# Patient Record
Sex: Female | Born: 1935 | Race: White | Hispanic: No | Marital: Single | Smoking: Never smoker
Health system: Southern US, Community
[De-identification: ages and names within clinical notes are randomized; demographics above are authoritative.]

## PROBLEM LIST (undated history)

## (undated) DIAGNOSIS — J189 Pneumonia, unspecified organism: Secondary | ICD-10-CM

## (undated) DIAGNOSIS — K219 Gastro-esophageal reflux disease without esophagitis: Secondary | ICD-10-CM

## (undated) HISTORY — DX: Gastro-esophageal reflux disease without esophagitis: K21.9

## (undated) HISTORY — PX: BREAST LUMPECTOMY: SHX2

## (undated) HISTORY — DX: Pneumonia, unspecified organism: J18.9

---

## 1993-02-27 HISTORY — PX: TOTAL KNEE ARTHROPLASTY: SHX125

## 2004-06-29 ENCOUNTER — Ambulatory Visit: Payer: Self-pay | Admitting: Internal Medicine

## 2004-12-26 ENCOUNTER — Ambulatory Visit: Payer: Self-pay | Admitting: Internal Medicine

## 2005-06-26 ENCOUNTER — Ambulatory Visit: Payer: Self-pay | Admitting: Internal Medicine

## 2005-07-18 ENCOUNTER — Ambulatory Visit: Payer: Self-pay | Admitting: Internal Medicine

## 2005-07-25 ENCOUNTER — Ambulatory Visit: Payer: Self-pay | Admitting: Internal Medicine

## 2005-08-18 ENCOUNTER — Ambulatory Visit: Payer: Self-pay | Admitting: Internal Medicine

## 2005-09-22 ENCOUNTER — Ambulatory Visit: Payer: Self-pay | Admitting: Internal Medicine

## 2006-02-23 ENCOUNTER — Ambulatory Visit: Payer: Self-pay | Admitting: Internal Medicine

## 2006-08-22 ENCOUNTER — Ambulatory Visit: Payer: Self-pay | Admitting: Internal Medicine

## 2006-12-11 DIAGNOSIS — K219 Gastro-esophageal reflux disease without esophagitis: Secondary | ICD-10-CM

## 2006-12-11 DIAGNOSIS — J4541 Moderate persistent asthma with (acute) exacerbation: Secondary | ICD-10-CM | POA: Insufficient documentation

## 2006-12-11 DIAGNOSIS — J302 Other seasonal allergic rhinitis: Secondary | ICD-10-CM | POA: Insufficient documentation

## 2006-12-11 DIAGNOSIS — J3089 Other allergic rhinitis: Secondary | ICD-10-CM

## 2007-02-22 ENCOUNTER — Ambulatory Visit: Payer: Self-pay | Admitting: Internal Medicine

## 2007-03-14 ENCOUNTER — Telehealth: Payer: Self-pay | Admitting: Internal Medicine

## 2007-04-29 IMAGING — CT CT PARANASAL SINUSES LIMITED
1 of 2 series · 16 of 25 positions shown, 20 images · non-contrast
Comparison: none

CLINICAL DATA: Sinus infection.  Antibiotics.  Nasal congestion.  
 LIMITED CT OF PARANASAL SINUSES:
TECHNIQUE: Limited coronal CT images were obtained through the paranasal sinuses without intravenous contrast.

[Series 4: ltd sinus 3.0 h30s · axial · 0.27mm/px · z∈[-121,-23]mm · 16 of 24 slices shown, 20 images]
[im 2/24  brain]
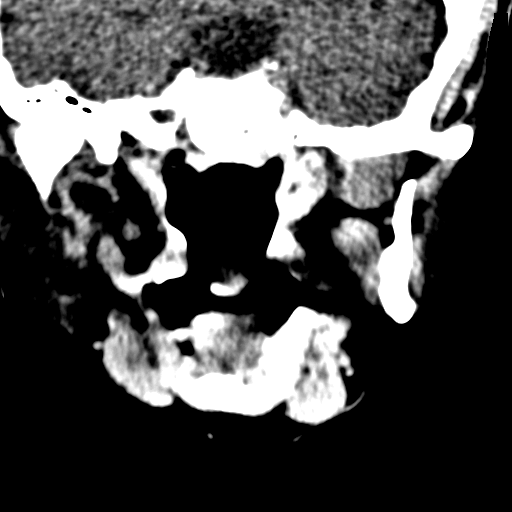
[im 2/24  bone]
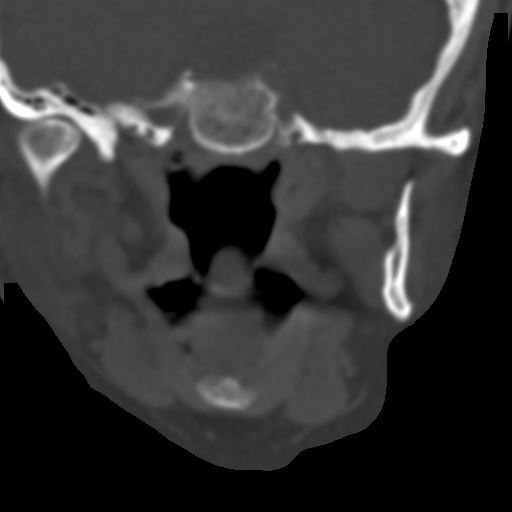
[im 4/24  bone]
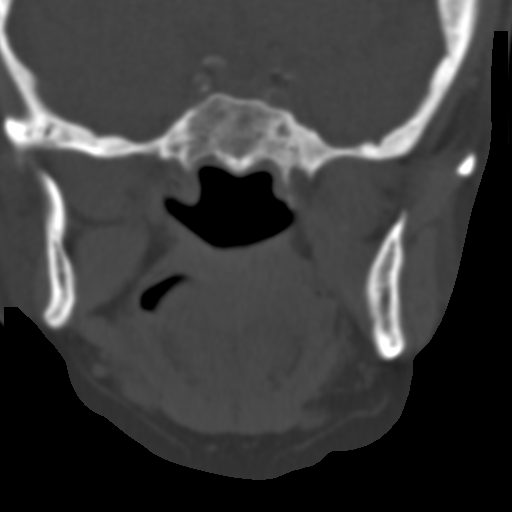
[im 5/24  bone]
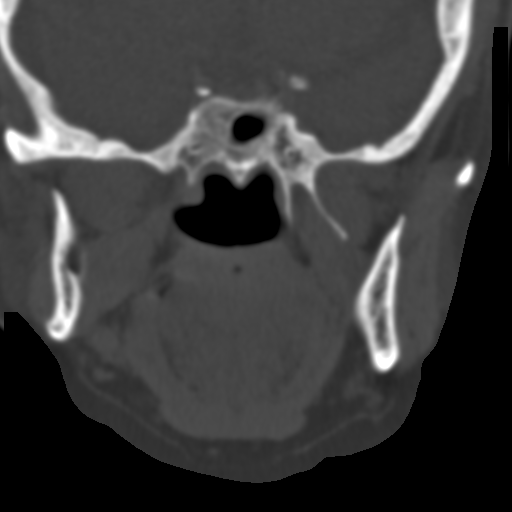
[im 6/24  bone]
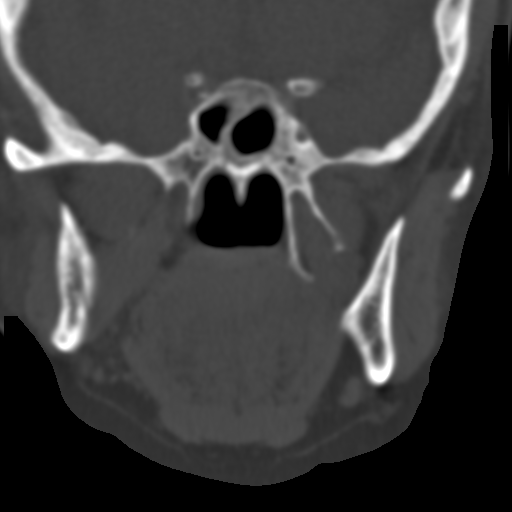
[im 8/24  brain]
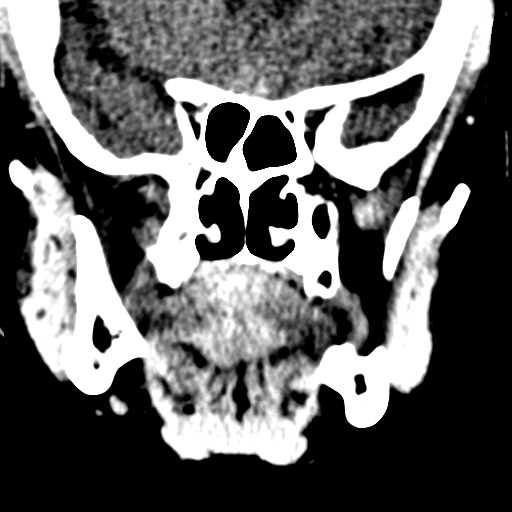
[im 8/24  bone]
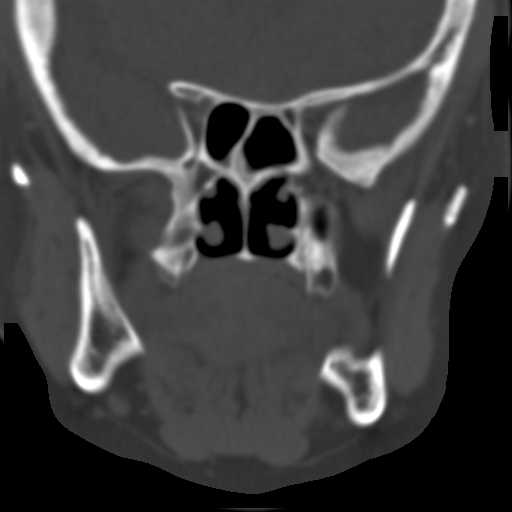
[im 9/24  bone]
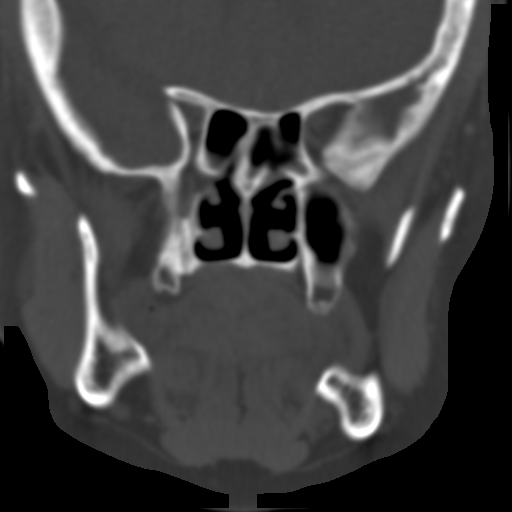
[im 10/24  bone]
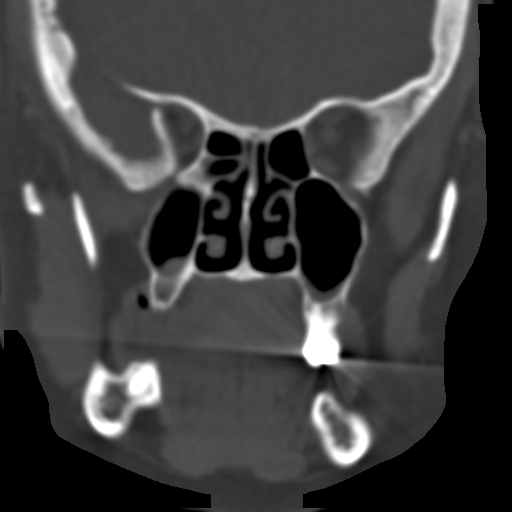
[im 12/24  bone]
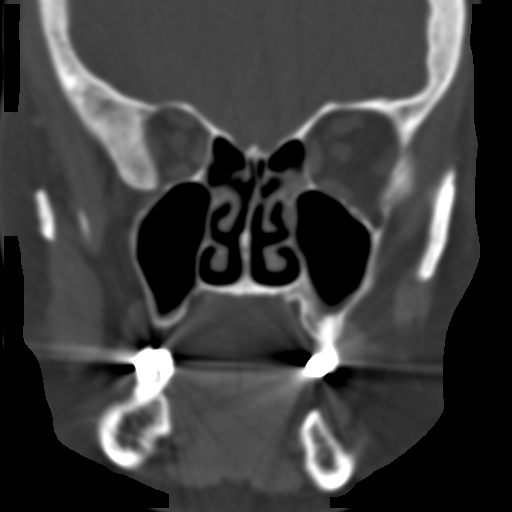
[im 13/24  brain]
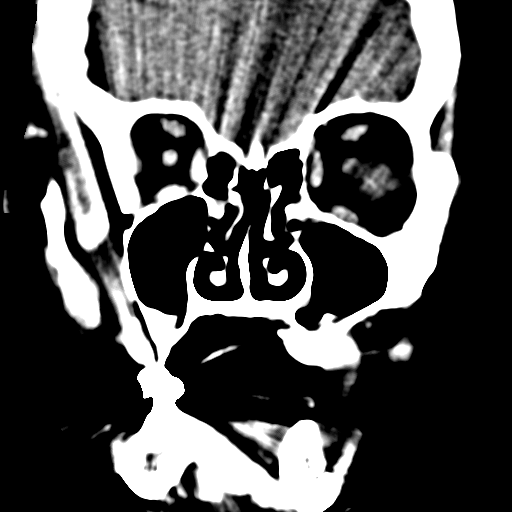
[im 13/24  bone]
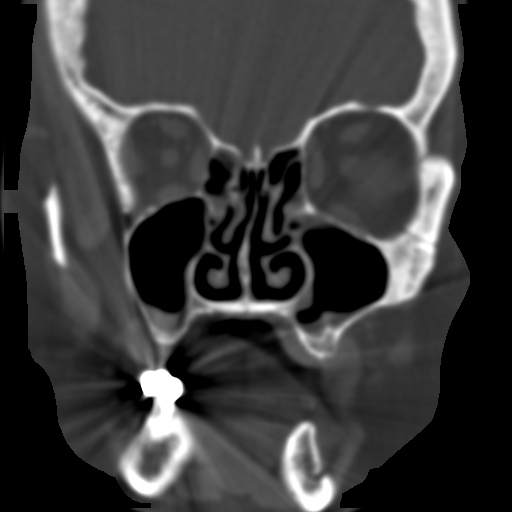
[im 15/24  bone]
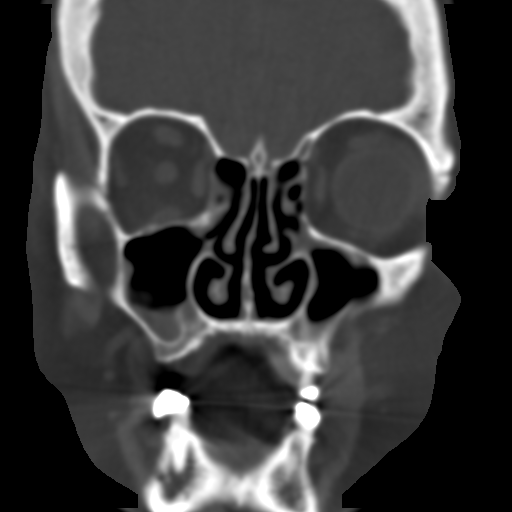
[im 16/24  bone]
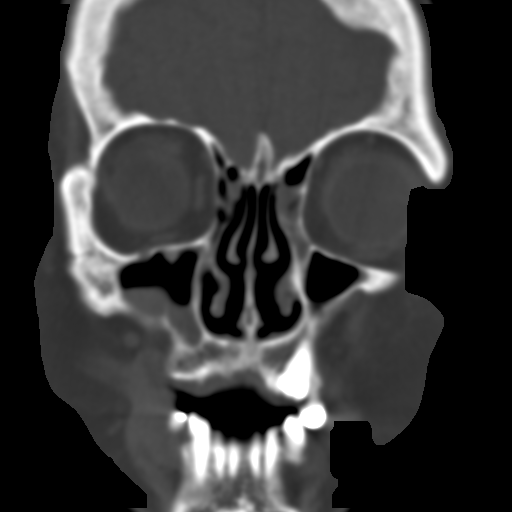
[im 17/24  bone]
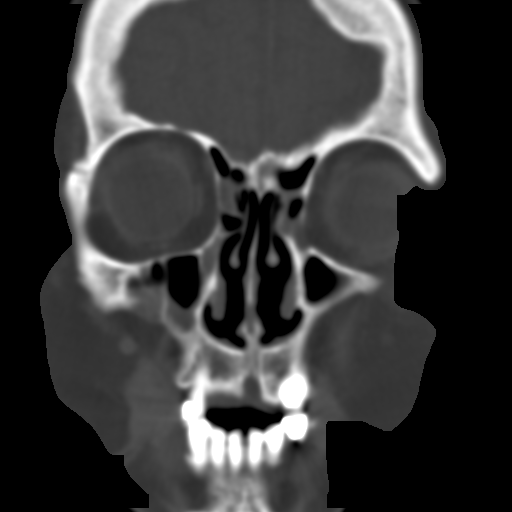
[im 19/24  brain]
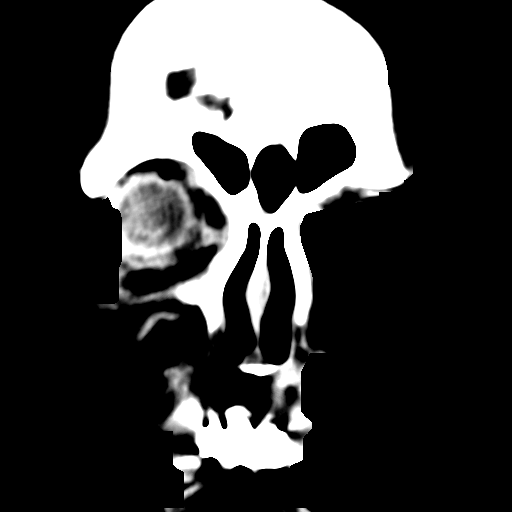
[im 19/24  bone]
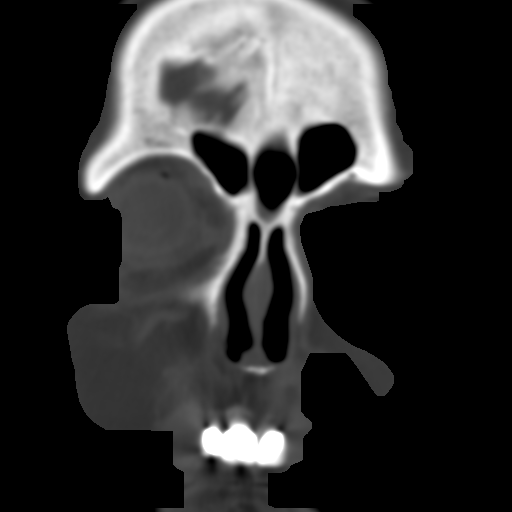
[im 20/24  bone]
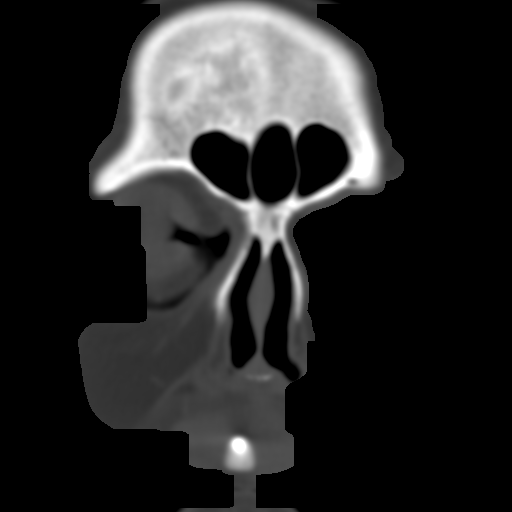
[im 21/24  bone]
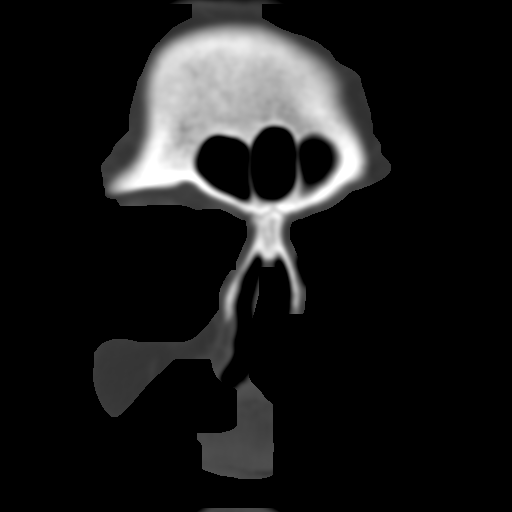
[im 23/24  bone]
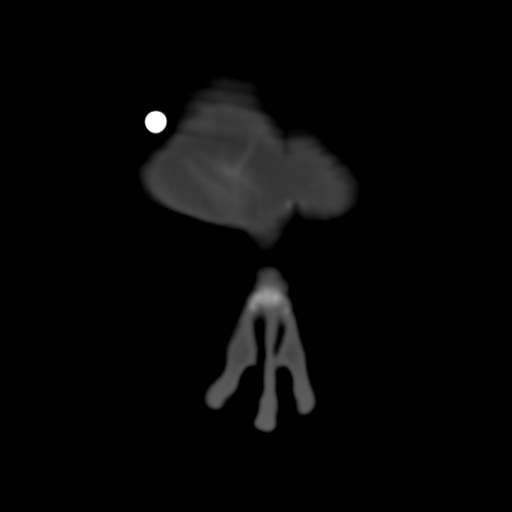

[16 of 25 positions shown; findings below may reference images not displayed]

FINDINGS: Pansinus mucosal thickening involving the maxillary sinuses including the ostial regions.  Mucosal thickening encroaching on the infundibula.  By comparison, minimal sphenoid sinus mucosal thickening.  Mucosal thickening involving the ethmoid sinuses bilaterally with opacification of a few ethmoid sinus air cells.  Mucosal thickening including the floor of the frontal sinus.  Mild nasal septal deviation.  Middle meatus patent.  Turbinates symmetrical.  No bony sinus wall abnormalities.  No air-fluid levels.
IMPRESSION: Findings compatible with chronic pansinusitis.  Mucosal disease encroaching on the maxillary sinus ostia and infundibula.

## 2007-08-23 ENCOUNTER — Ambulatory Visit: Payer: Self-pay | Admitting: Internal Medicine

## 2007-12-10 ENCOUNTER — Encounter: Payer: Self-pay | Admitting: Internal Medicine

## 2008-02-06 ENCOUNTER — Telehealth (INDEPENDENT_AMBULATORY_CARE_PROVIDER_SITE_OTHER): Payer: Self-pay | Admitting: *Deleted

## 2008-02-07 ENCOUNTER — Telehealth (INDEPENDENT_AMBULATORY_CARE_PROVIDER_SITE_OTHER): Payer: Self-pay | Admitting: *Deleted

## 2008-03-19 ENCOUNTER — Ambulatory Visit: Payer: Self-pay | Admitting: Internal Medicine

## 2008-03-19 DIAGNOSIS — J329 Chronic sinusitis, unspecified: Secondary | ICD-10-CM

## 2008-11-23 ENCOUNTER — Telehealth: Payer: Self-pay | Admitting: Internal Medicine

## 2009-03-19 ENCOUNTER — Ambulatory Visit: Payer: Self-pay | Admitting: Internal Medicine

## 2009-10-25 ENCOUNTER — Telehealth (INDEPENDENT_AMBULATORY_CARE_PROVIDER_SITE_OTHER): Payer: Self-pay | Admitting: *Deleted

## 2010-03-15 ENCOUNTER — Ambulatory Visit
Admission: RE | Admit: 2010-03-15 | Discharge: 2010-03-15 | Payer: Self-pay | Source: Home / Self Care | Attending: Internal Medicine | Admitting: Internal Medicine

## 2010-03-15 DIAGNOSIS — J4530 Mild persistent asthma, uncomplicated: Secondary | ICD-10-CM | POA: Insufficient documentation

## 2010-03-29 NOTE — Progress Notes (Signed)
Summary: speak to nurse  Phone Note Call from Patient Call back at Home Phone [phone]   Caller: Patient Call For: young Summary of Call: Pt c/o hoarseness, sore throat, watery eyes x2 wks. wants an antibiotic called in, pls advise.//[facility] Initial call taken by: Darletta Moll,  October 25, 2009 3:04 PM  Follow-up for Phone Call        Dr. Maple Hudson, pt c/o voice "coming and going," nasal congestion, watery eyes, and occ green mucus x 2 wk.  Denies f/c/s.  Requesting abx.   [facility] [address] [facility] in [place] Allergies: floxin, asa, tylenox, lisinopril, niaspan, voltaren, levaquin Pls advise.  Follow-up by: Gweneth Dimitri RN,  October 25, 2009 3:13 PM  Additional Follow-up for Phone Call Additional follow up Details #1::        Per CDY- okay to give Doxycycline 100mg  #8 take 2 today then 1 daily unitl gone no refills.Reynaldo Minium CMA  October 25, 2009 3:35 PM   Called, spoke with pt.  Pt informed of above recs per CY and aware doxy rx sent to [facility].  She verbalized understanding  Additional Follow-up by: Gweneth Dimitri RN,  October 25, 2009 3:40 PM    New/Updated Medications: DOXYCYCLINE HYCLATE 100 MG CAPS (DOXYCYCLINE HYCLATE) take 2 today then 1 by mouth once daily until gone Prescriptions: DOXYCYCLINE HYCLATE 100 MG CAPS (DOXYCYCLINE HYCLATE) take 2 today then 1 by mouth once daily until gone  #8 x 0   Entered by:   Gweneth Dimitri RN   Authorized by:   Waymon Budge MD   Signed by:   Gweneth Dimitri RN on 10/25/2009   Method used:   Electronically to        [facility]* (retail)       [address]       [place], [place]  [address]       Ph: [phone]       Fax: [phone]   RxID:   [id number]

## 2010-03-29 NOTE — Assessment & Plan Note (Signed)
Summary: 1 year fu//mbw   Primary Provider/Referring Provider:  Raeanne Gathers in [place]  CC:  follow up visit-Increased Cough and hoarsness; productive at times-slight yellow x 2 weeks. Marland Kitchen  History of Present Illness: 08/23/07- 77 year old woman returning for follow-up of asthmatic bronchitis and allergic rhinitis.  Five days ago she developed increasing cough with purulent sputum, but no fever.  She, did she's getting better on her own.  Throat is not sore.  No swollen glands.  No GI upset.  03/19/08- Asthmatic bronchits, allergic rhinitis 2 weeks ago resolved rhinosinusitis with called- in Zpak. Was well for a week, but in the past got head congestion, sore throats, no fever or headache. No chest discomfort. Yellow now turning green.Ears stopped up.   March 19, 2009- Asthmatic bronchitis, allergic rhinitis Very good year. She called [place] once months ago. Starting about a week ago she again got a bronchitis with hoarseness, cough/ yellow to white, sore throat, nasal drainage biut no fever. Never got very wheezey. Proair did help. She had flu vax. took amoxacillin in September.    Current Medications (verified): 1)  Hydrochlorothiazide 25 Mg  Tabs (Hydrochlorothiazide) 2)  Synthroid 50 Mcg  Tabs (Levothyroxine Sodium) .Marland Kitchen.. 1 By Mouth Once Daily 3)  Flovent Hfa 110 Mcg/act  Aero (Fluticasone Propionate  Hfa) .... Two Times A Day 4)  Metformin Hcl 1000 Mg  Tabs (Metformin Hcl) .Marland Kitchen.. 1 By Mouth Two Times A Day 5)  Proair Hfa 108 (90 Base) Mcg/act  Aers (Albuterol Sulfate) .... 2 Puffs Four Times A Day As Needed  Allergies: 1)  ! Floxin 2)  ! Asa 3)  ! * Tylenox 4)  ! Lisinopril 5)  ! * Niaspan 6)  Voltaren 7)  Levaquin (Levofloxacin)  Past History:  Past Medical History: Last updated: 03/19/2008 Allergic rhinitis GERD Hx pneumonia  Past Surgical History: Last updated: 03/19/2008 Left knee- TKR 1995 benign left breast lump  Family History: Last updated: 02/22/2007 Grandfather-  MI Brother - MI age 56 Mother lung ca- nonsmoker  Social History: Last updated: 03/19/2009 Patient never smoked.  Never married  Risk Factors: Smoking Status: never (02/22/2007)  Social History: Patient never smoked.  Never married  Review of Systems      See HPI       The patient complains of hoarseness.  The patient denies anorexia, fever, weight loss, weight gain, vision loss, decreased hearing, chest pain, syncope, dyspnea on exertion, peripheral edema, prolonged cough, headaches, hemoptysis, abdominal pain, and severe indigestion/heartburn.    Vital Signs:  Patient profile:   77 year old female Height:      64 inches Weight:      155.25 pounds BMI:     26.74 O2 Sat:      97 % on Room air Pulse rate:   80 / minute BP sitting:   144 / 82  (left arm) Cuff size:   regular  Vitals Entered By: Reynaldo Minium CMA (March 19, 2009 10:08 AM)  O2 Flow:  Room air  Physical Exam  Additional Exam:  General: A/Ox3; pleasant and cooperative, NAD, SKIN: no rash, lesions NODES: no lymphadenopathy HEENT: [place]/AT, EOM- WNL, Conjuctivae- clear, PERRLA, TM-, Nose-thick white mucus bilaterally with nasal speech and periorbital edema- , Throat- clear and wnl NECK: Supple w/ fair ROM, JVD- none, normal carotid impulses w/o bruits Thyroid- normal to palpation CHEST: clear with quiet breathing but coughed after deep breath HEART: RRR, no m/g/r heard ABDOMEN: Soft and nl;  VOZ:DGUY, nl pulses, no edema  NEURO:  Grossly intact to observation      Impression & Recommendations:  Problem # 1:  Hx of ASTHMATIC BRONCHITIS, ACUTE (ICD-466.0)  Recent viral pattern acute bronchitis. We will refill meds on demand. She doesn't need intervention now. She hasn't had cxr in years- we agreed to get that done today. The following medications were removed from the medication list:    Amoxicillin 500 Mg Tabs (Amoxicillin) .Marland Kitchen... Take 1 tablet by mouth three times a day Her updated medication list for  this problem includes:    Flovent Hfa 110 Mcg/act Aero (Fluticasone propionate  hfa) .Marland Kitchen..Marland Kitchen Two times a day    Proair Hfa 108 (90 Base) Mcg/act Aers (Albuterol sulfate) .Marland Kitchen... 2 puffs four times a day as needed  Problem # 2:  ALLERGIC RHINITIS (ICD-477.9) Assessment: Comment Only  Other Orders: Est. Patient Level III (24401) T-2 View CXR (71020TC)  Patient Instructions: 1)  Schedule return in one year, earlier if needed 2)  Call for med refills as needed. 3)  A chest x-ray has been recommended.  Your imaging study may require preauthorization.

## 2010-03-31 NOTE — Assessment & Plan Note (Signed)
Summary: rov 1 yr ///kp   Primary Provider/Referring Provider:  Raeanne Gathers in [place]  CC:  Yearly follow up visit-asthmatic bronchitis and allergic rhinitis-need 90 day refill on flovent HFA sent to drug store.Marland Kitchen  History of Present Illness: 08/23/07- 77 year old woman returning for follow-up of asthmatic bronchitis and allergic rhinitis.  Five days ago she developed increasing cough with purulent sputum, but no fever.  She, did she's getting better on her own.  Throat is not sore.  No swollen glands.  No GI upset.  03/19/08- Asthmatic bronchits, allergic rhinitis 2 weeks ago resolved rhinosinusitis with called- in Zpak. Was well for a week, but in the past got head congestion, sore throats, no fever or headache. No chest discomfort. Yellow now turning green.Ears stopped up.  March 19, 2009- Asthmatic bronchitis, allergic rhinitis Very good year. She called [place] once months ago. Starting about a week ago she again got a bronchitis with hoarseness, cough/ yellow to white, sore throat, nasal drainage biut no fever. Never got very wheezey. Proair did help. She had flu vax. took amoxacillin in September.   March 15, 2010- Asthmatic bronchitis, allergic rhinitis Nurse-CC: Yearly follow up visit-asthmatic bronchitis and allergic rhinitis-need 90 day refill on flovent HFA sent to drug store. She considers this a good year, having needed to call us acutely only once. Had flu vac.  Rare need for rescue inhaler- lst was in October. Today feels well, admitting only a little nasal congestion left nostril, w/o cough or wheeze.  CXR 03/19/09- scarring but NAD.         Preventive Screening-Counseling & Management  Alcohol-Tobacco     Smoking Status: never  Current Medications (verified): 1)  Hydrochlorothiazide 25 Mg  Tabs (Hydrochlorothiazide) 2)  Synthroid 50 Mcg  Tabs (Levothyroxine Sodium) .Marland Kitchen.. 1 By Mouth Once Daily 3)  Flovent Hfa 110 Mcg/act  Aero (Fluticasone Propionate  Hfa) .... Two  Times A Day 4)  Metformin Hcl 1000 Mg  Tabs (Metformin Hcl) .Marland Kitchen.. 1 By Mouth Two Times A Day 5)  Proair Hfa 108 (90 Base) Mcg/act  Aers (Albuterol Sulfate) .... 2 Puffs Four Times A Day As Needed 6)  Restasis 0.05 % Emul (Cyclosporine) .Marland Kitchen.. 1 Drop Each Eye Two Times A Day  Allergies (verified): 1)  ! Floxin 2)  ! Asa 3)  ! * Tylenox 4)  ! Lisinopril 5)  ! * Niaspan 6)  Voltaren 7)  Levaquin (Levofloxacin)  Past History:  Past Surgical History: Last updated: 03/19/2008 Left knee- TKR 1995 benign left breast lump  Family History: Last updated: 02/22/2007 Grandfather- MI Brother - MI age 65 Mother lung ca- nonsmoker  Social History: Last updated: 03/19/2009 Patient never smoked.  Never married  Risk Factors: Smoking Status: never (03/15/2010)  Past Medical History: Allergic rhinitis Asthma  GERD Hx pneumonia  Review of Systems      See HPI       The patient complains of nasal congestion/difficulty breathing through nose.  The patient denies shortness of breath with activity, shortness of breath at rest, productive cough, non-productive cough, coughing up blood, chest pain, irregular heartbeats, acid heartburn, indigestion, loss of appetite, weight change, abdominal pain, difficulty swallowing, sore throat, tooth/dental problems, headaches, and sneezing.    Vital Signs:  Patient profile:   77 year old female Height:      64 inches Weight:      148 pounds BMI:     25.50 O2 Sat:      100 % on Room air Pulse rate:  74 / minute BP sitting:   136 / 78  (left arm) Cuff size:   regular  Vitals Entered By: Reynaldo Minium CMA (March 15, 2010 9:16 AM)  O2 Flow:  Room air CC: Yearly follow up visit-asthmatic bronchitis and allergic rhinitis-need 90 day refill on flovent HFA sent to drug store.   Physical Exam  Additional Exam:  General: A/Ox3; pleasant and cooperative, NAD, SKIN: no rash, lesions NODES: no lymphadenopathy HEENT: /AT, EOM- WNL, Conjuctivae-  clear, PERRLA, TM-, Nose-thick white mucus bilaterally with  slightly nasal speech and periorbital edema- , Throat- clear and wnl NECK: Supple w/ fair ROM, JVD- none, normal carotid impulses w/o bruits Thyroid- normal to palpation CHEST: clear with quiet breathing, no wheeze or cough HEART: RRR, no m/g/r heard ABDOMEN: Soft and nl;  UVO:ZDGU, nl pulses, no edema  NEURO: Grossly intact to observation      Impression & Recommendations:  Problem # 1:  ALLERGIC RHINITIS (ICD-477.9)  Good control with a minimal perennial rhinitis and hx of recurrent rhinosinusitis in the past.   Problem # 2:  ASTHMA, UNSPECIFIED, UNSPECIFIED STATUS (ICD-493.90) Mild intermittent and well controlled.   Medications Added to Medication List This Visit: 1)  Restasis 0.05 % Emul (Cyclosporine) .Marland Kitchen.. 1 drop each eye two times a day  Other Orders: Est. Patient Level III (44034)  Patient Instructions: 1)  Please schedule a follow-up appointment in 1 year. 2)  Refill Flovent for [facility] 3)  Refill Proair rescue inhaler for local refill Prescriptions: PROAIR HFA 108 (90 BASE) MCG/ACT  AERS (ALBUTEROL SULFATE) 2 puffs four times a day as needed  #1 x prn   Entered and Authorized by:   Waymon Budge MD   Signed by:   Waymon Budge MD on 03/15/2010   Method used:   Print then Give to Patient   RxID:   [id number] FLOVENT HFA 110 MCG/ACT  AERO (FLUTICASONE PROPIONATE  HFA) two times a day  #3 x 3   Entered and Authorized by:   Waymon Budge MD   Signed by:   Waymon Budge MD on 03/15/2010   Method used:   Print then Give to Patient   RxID:   [id number]

## 2010-07-12 NOTE — Assessment & Plan Note (Signed)
[facility]                             PULMONARY OFFICE NOTE   NAME:Kathleen Conrad, Kathleen Conrad                        MRN:          [id number]  DATE:08/22/2006                            DOB:          Jan 04, 1930    PROBLEM:  1. Asthmatic bronchitis.  2. Allergic rhinitis.  3. Esophageal reflux.   HISTORY:  Sneezing only. No chest congestion or wheezing. She has been  fairly successful using Clarinex D but needs Flovent 110 refilled.   MEDICATIONS:  1. Hydrochlorothiazide 25 mg.  2. Synthroid 0.05 mg.  3. Glyburide/metformin 2.5/500.  4. Nasonex.  5. Flovent 110 two puffs b.i.d.  6. Clarinex D p.r.n.   DRUG INTOLERANCES:  VOLTAREN, LEVAQUIN, FLOXIN.   OBJECTIVE:  Weight 164 pounds, BP 164/84, pulse 67, room air saturation  96%.  Mild nasal congestion, but turbinates do not look inflamed. Pharynx is  clear. Voice quality is normal.  LUNGS:  Are clear.  HEART:  Sounds normal.   IMPRESSION:  Allergic rhinitis and mild intermittent asthma, adequately  controlled.   PLAN:  We refilled her Flovent. Discussed environmental precautions.  Schedule return in 6 months, earlier p.r.n.     Clinton D. Maple Hudson, MD, Tonny Bollman, FACP  Electronically Signed    CDY/MedQ  DD: 08/26/2006  DT: 08/27/2006  Job #: [id number]

## 2010-07-15 NOTE — Assessment & Plan Note (Signed)
[facility]                             PULMONARY OFFICE NOTE   NAME:Kathleen Conrad                        MRN:          [id number]  DATE:02/23/2006                            DOB:          October 09, 1929    PULMONARY FOLLOWUP  1. Asthmatic bronchitis.  2. Allergic rhinitis.  3. Esophageal reflux.   HISTORY:  Scheduled followup.  In the past 3 weeks she has had some  variable stuffy nose, better some days than others, but no involvement  of her chest.  She has Nasonex, but had been using it only  intermittently, not regularly enough for it to work.  She has not had  headache, fever, sore throat or purulent discharge.   MEDICATIONS:  1. Hydrochlorothiazide 25 mg.  2. Synthroid 0.05.  3. Glyburide/metformin 2.5/500.  4. Nasonex.  5. Flovent 110.  6. Clarinex D.  7. Occasional Neo-Synephrine nose drop.   Drug intolerant of VOLTAREN, LEVAQUIN, and FLOXIN.  We discussed the Neo-  Synephrine nose drops.   OBJECTIVE:  Weight 163 pounds, blood pressure 132/80, pulse regular at  66, room air saturation 97%.  Nasal mucosa is pale, somewhat edematous, but not obstructed.  There is  no postnasal drainage.  The pharynx is clear.  LUNGS:  Clear.  HEART:  Sounds regular and normal.   IMPRESSION:  1. Some exacerbation of nasal congestion may reflect allergic and      nonallergic rhinitis.  2. Asthma is stable and well-controlled.   PLAN:  1. First try regular daily use of Nasonex as discussed.  2. May try Sudafed PE over-the-counter, as needed and tolerated.  3. Minimize use of topical nasal vasoconstrictors.  4. Schedule return in 6 months, earlier p.r.n.     Clinton D. Maple Hudson, MD, Tonny Bollman, FACP  Electronically Signed    CDY/MedQ  DD: 02/24/2006  DT: 02/24/2006  Job #: [id number]

## 2010-07-15 NOTE — Assessment & Plan Note (Signed)
[facility]                               PULMONARY OFFICE NOTE   NAME:Kathleen Conrad, Kathleen Conrad                        MRN:          [id number]  DATE:09/22/2005                            DOB:          08/22/1929    PROBLEM LIST:  1.  Asthmatic bronchitis.  2.  Allergic rhinitis.  3.  Esophageal reflux.   HISTORY:  She had prolonged slow recovery from bronchitis in the spring, but  finally got over that and says she is doing very well now.  She always uses  Flovent 110 two puffs b.i.d.  Admits frequent heartburn only when asked  directly about it.  She had not assigned any importance and has not been  taking any therapy for it.  There has been no fever or blood, nothing  purulent, no palpitations or ankle edema.   MEDICATIONS:  1.  Hydrochlorothiazide 25 mg.  2.  Synthroid 0.05 mg.  3.  Crestor 20 mg.  4.  Glyburide/metformin 2.5/500.  5.  Naphcon eye drops.  6.  Vytorin 10/20.  7.  Flovent 110 two puffs b.i.d.  8.  Occasional Clarinex D.   ALLERGIES:  DRUG INTOLERANCE TO VOLTAREN, LEVAQUIN, AND FLOXIN.   OBJECTIVE:  VITAL SIGNS:  Weight 156 pounds, BP 168/88, pulse rate 64, room  air saturation 97%.  GENERAL:  She seems in good spirits and comfortable.  NECK:  I do not find adenopathy or edema.  HEENT:  Minimal hoarseness without pharyngeal erythema or stridor.  No post  nasal drainage seen.  LUNGS:  Clear to P&A.  HEART:  Sounds regular without murmur.   IMPRESSION:  1.  Previous rhinitis.  Rhinosinusitis component has cleared.  2.  Asthmatic bronchitis.  3.  Esophageal reflux.   PLAN:  1.  Reflux precautions.  2.  Over-the-counter Prilosec one daily.  3.  Schedule return in 6 months, earlier p.r.n.  4.  No changes in asthma therapy as long as she is remaining this stable.                                   Clinton D. Maple Hudson, MD, FCCP, FACP   CDY/MedQ  DD:  09/22/2005  DT:  09/22/2005  Job #:  [id number]   cc:   Dr. Raeanne Gathers

## 2011-03-01 ENCOUNTER — Ambulatory Visit: Payer: Self-pay | Admitting: Internal Medicine

## 2011-03-02 ENCOUNTER — Encounter: Payer: Self-pay | Admitting: Internal Medicine

## 2011-03-03 ENCOUNTER — Ambulatory Visit (INDEPENDENT_AMBULATORY_CARE_PROVIDER_SITE_OTHER): Payer: Medicare Other | Admitting: Internal Medicine

## 2011-03-03 ENCOUNTER — Encounter: Payer: Self-pay | Admitting: Internal Medicine

## 2011-03-03 VITALS — BP 136/82 | HR 73 | Ht 64.0 in | Wt 150.8 lb

## 2011-03-03 DIAGNOSIS — J329 Chronic sinusitis, unspecified: Secondary | ICD-10-CM

## 2011-03-03 DIAGNOSIS — J45909 Unspecified asthma, uncomplicated: Secondary | ICD-10-CM

## 2011-03-03 DIAGNOSIS — J309 Allergic rhinitis, unspecified: Secondary | ICD-10-CM

## 2011-03-03 MED ORDER — PHENYLEPHRINE HCL 1 % NA SOLN
3.0000 [drp] | Freq: Once | NASAL | Status: AC
  Administered 2011-03-03: 3 [drp] via NASAL

## 2011-03-03 MED ORDER — METHYLPREDNISOLONE ACETATE 80 MG/ML IJ SUSP
80.0000 mg | Freq: Once | INTRAMUSCULAR | Status: AC
  Administered 2011-03-03: 80 mg via INTRAMUSCULAR

## 2011-03-03 MED ORDER — FLUTICASONE PROPIONATE HFA 110 MCG/ACT IN AERO: INHALATION_SPRAY | RESPIRATORY_TRACT | Status: DC

## 2011-03-03 MED ORDER — AZITHROMYCIN 250 MG PO TABS: ORAL_TABLET | ORAL | Status: AC

## 2011-03-03 NOTE — Patient Instructions (Addendum)
Neb nasal neo  Depo 80  Script to hold for antibiotic if needed  Mucus thinner/ decongestant like Mucinex -D at pharmacy counter might help if needed  Flovent refilled

## 2011-03-03 NOTE — Progress Notes (Signed)
03/03/11- 81 yoF never smoker followed for asthma, allergic rhinitis LOV-03/15/2010 She says this was a good year except that for the last few weeks she's had increased head congestion with nasal drainage and some yellow mucus. Nasal congestion causes her to sleep upright at times. Denies fever, sore throat, frankly purulent bloody sputum, chest congestion or wheeze.  ROS-see HPI Constitutional:   No-   weight loss, night sweats, fevers, chills, fatigue, lassitude. HEENT:   No-  headaches, difficulty swallowing, tooth/dental problems, sore throat,       No-  sneezing, itching, ear ache,   +nasal congestion, post nasal drip,  CV:  No-   chest pain, orthopnea, PND, swelling in lower extremities, anasarca,  dizziness, palpitations Resp: No-   shortness of breath with exertion or at rest.              No-   productive cough,  No non-productive cough,  No- coughing up of blood.              No-   change in color of mucus.  No- wheezing.   Skin: No-   rash or lesions. GI:  No-   heartburn, indigestion, abdominal pain, nausea, vomiting, diarrhea,                 change in bowel habits, loss of appetite GU:  MS:  No-   joint pain or swelling.  No- decreased range of motion.  No- back pain. Neuro-     nothing unusual Psych:  No- change in mood or affect. No depression or anxiety.  No memory loss.  OBJ General- Alert, Oriented, Affect-appropriate, Distress- none acute Skin- rash-none, lesions- none, excoriation- none Lymphadenopathy- none Head- atraumatic            Eyes- Gross vision intact, PERRLA, conjunctivae clear secretions            Ears- Hearing, canals-normal            Nose- sniffing, turbinate edema, no-Septal dev, mucus, polyps, erosion, perforation             Throat- Mallampati II , mucosa red , drainage- none, tonsils- atrophic Neck- flexible , trachea midline, no stridor , thyroid nl, carotid no bruit Chest - symmetrical excursion , unlabored           Heart/CV- RRR , no murmur ,  no gallop  , no rub, nl s1 s2                           - JVD- none , edema- none, stasis changes- none, varices- none           Lung- clear to P&A, wheeze- none, cough- none , dullness-none, rub- none           Chest wall-  Abd- Br/ Gen/ Rectal- Not done, not indicated Extrem- cyanosis- none, clubbing, none, atrophy- none, strength- nl Neuro- grossly intact to observation

## 2011-03-05 NOTE — Assessment & Plan Note (Signed)
Symptoms would fit with a low grade sinus infection or nonspecific rhinitis. Plan-nasal nebulizer, Depo-Medrol, Mucinex D., saline rinse, Z-Pak

## 2011-03-05 NOTE — Assessment & Plan Note (Signed)
I don't think the current episode is allergic in nature. We discussed the comparison between allergic and nonallergic nasal symptoms.

## 2011-12-08 ENCOUNTER — Other Ambulatory Visit: Payer: Self-pay | Admitting: *Deleted

## 2011-12-08 MED ORDER — FLUTICASONE PROPIONATE HFA 110 MCG/ACT IN AERO: INHALATION_SPRAY | RESPIRATORY_TRACT | Status: DC

## 2011-12-28 ENCOUNTER — Telehealth: Payer: Self-pay | Admitting: Internal Medicine

## 2011-12-28 MED ORDER — PREDNISONE 20 MG PO TABS: 20.0000 mg | ORAL_TABLET | Freq: Every day | ORAL | Status: DC

## 2011-12-28 NOTE — Telephone Encounter (Signed)
Last OV 03/03/11. Pending appt 03/04/12. Pt c/o prod cough with white sputum, occ wheezing, sinus drainage that is clear, watery eyes. Pt denies any sob, fever or sore throat. She has had these sxs off and on for several weeks. Pt has been taking OTC allergy relief that helps very little. Pls advise. Allergies  Allergen Reactions  . Aspirin   . Diclofenac Sodium   . Levofloxacin   . Lisinopril   . Niacin   . Ofloxacin     REACTION: angioedema

## 2011-12-28 NOTE — Telephone Encounter (Signed)
Pt aware of CDY recs and will call if she continues to have issues with cough or if sxs become worse. RX sent.

## 2011-12-28 NOTE — Telephone Encounter (Signed)
Doesn't sound like infection. Suggest prednisone 20 mg, # 5, 1 daily.

## 2012-03-04 ENCOUNTER — Ambulatory Visit: Payer: Medicare Other | Admitting: Internal Medicine

## 2012-03-06 ENCOUNTER — Ambulatory Visit (INDEPENDENT_AMBULATORY_CARE_PROVIDER_SITE_OTHER)
Admission: RE | Admit: 2012-03-06 | Discharge: 2012-03-06 | Disposition: A | Discharge status: Home / Self Care | Payer: Medicare Other | Source: Ambulatory Visit | Attending: Internal Medicine | Admitting: Internal Medicine

## 2012-03-06 ENCOUNTER — Encounter: Payer: Self-pay | Admitting: Internal Medicine

## 2012-03-06 ENCOUNTER — Ambulatory Visit (INDEPENDENT_AMBULATORY_CARE_PROVIDER_SITE_OTHER): Payer: Medicare Other | Admitting: Internal Medicine

## 2012-03-06 VITALS — BP 168/92 | HR 76 | Ht 64.0 in | Wt 151.0 lb

## 2012-03-06 DIAGNOSIS — J209 Acute bronchitis, unspecified: Secondary | ICD-10-CM

## 2012-03-06 DIAGNOSIS — J45909 Unspecified asthma, uncomplicated: Secondary | ICD-10-CM

## 2012-03-06 DIAGNOSIS — J329 Chronic sinusitis, unspecified: Secondary | ICD-10-CM

## 2012-03-06 MED ORDER — ALBUTEROL SULFATE HFA 108 (90 BASE) MCG/ACT IN AERS: 2.0000 | INHALATION_SPRAY | Freq: Four times a day (QID) | RESPIRATORY_TRACT | Status: DC | PRN

## 2012-03-06 MED ORDER — ALBUTEROL SULFATE HFA 108 (90 BASE) MCG/ACT IN AERS: 2.0000 | INHALATION_SPRAY | Freq: Four times a day (QID) | RESPIRATORY_TRACT | Status: AC | PRN

## 2012-03-06 NOTE — Progress Notes (Signed)
03/03/11- 81 yoF never smoker followed for asthma, allergic rhinitis LOV-03/15/2010 She says this was a good year except that for the last few weeks she's had increased head congestion with nasal drainage and some yellow mucus. Nasal congestion causes her to sleep upright at times. Denies fever, sore throat, frankly purulent bloody sputum, chest congestion or wheeze.  03/06/12- 82 yoF never smoker followed for asthma, allergic rhinitis FOLLOWS FOR: breathing has improved. cough has resolved. congestion has resolved as well. The prednisone for chest congestion but is now back to baseline says blood pressure is up a little today because she got lost coming here. No routine cough and rare need for her inhaler.  ROS-see HPI Constitutional:   No-   weight loss, night sweats, fevers, chills, fatigue, lassitude. HEENT:   No-  headaches, difficulty swallowing, tooth/dental problems, sore throat,       No-  sneezing, itching, ear ache,   +nasal congestion, +post nasal drip,  CV:  No-   chest pain, orthopnea, PND, swelling in lower extremities, anasarca,  dizziness, palpitations Resp: No- acute  shortness of breath with exertion or at rest.              No-   productive cough,  No non-productive cough,  No- coughing up of blood.              No-   change in color of mucus.  No- wheezing.   Skin: No-   rash or lesions. GI:  No-   heartburn, indigestion, abdominal pain, nausea, vomiting,  GU:  MS:  No-   joint pain or swelling.  . Neuro-     nothing unusual Psych:  No- change in mood or affect. No depression or anxiety.  No memory loss.  OBJ General- Alert, Oriented, Affect-appropriate, Distress- none acute Skin- rash-none, lesions- none, excoriation- none Lymphadenopathy- none Head- atraumatic            Eyes- Gross vision intact, PERRLA, conjunctivae clear secretions            Ears- Hearing, canals-normal            Nose- sniffing, turbinate edema, no-Septal dev, mucus, polyps, erosion, perforation               Throat- Mallampati II , mucosa normal , drainage+ mucoid post nasal drip, tonsils- atrophic. Mild hoarseness Neck- flexible , trachea midline, no stridor , thyroid nl, carotid no bruit Chest - symmetrical excursion , unlabored           Heart/CV- RRR , no murmur , no gallop  , no rub, nl s1 s2                           - JVD- none , edema- none, stasis changes- none, varices- none           Lung- clear to P&A, wheeze- none, cough- none , dullness-none, rub- none           Chest wall-  Abd- Br/ Gen/ Rectal- Not done, not indicated Extrem- cyanosis- none, clubbing, none, atrophy- none, strength- nl Neuro- grossly intact to observation

## 2012-03-06 NOTE — Patient Instructions (Addendum)
Script to refill your albuterol HFA rescue inhaler through your mail order company, 1 inhaler every three months  Order CXR- dx asthma with bronchitis

## 2012-03-07 ENCOUNTER — Telehealth: Payer: Self-pay | Admitting: Internal Medicine

## 2012-03-07 NOTE — Telephone Encounter (Signed)
Result Note     CXR- some over-inflation consistent w/ hx of asthma, but clear lungs and no active process.   ----  I spoke with patient about results and she verbalized understanding and had no questions

## 2012-03-17 NOTE — Assessment & Plan Note (Signed)
Bland postnasal drainage now looks like residual from a viral URI. She can treat symptomatically and let us know if it fails to clear.

## 2012-03-17 NOTE — Assessment & Plan Note (Signed)
Now well-controlled but had needed prednisone previously. She has gone through most of the year without needing her inhalers. Plan-chest x-ray

## 2012-12-03 ENCOUNTER — Telehealth: Payer: Self-pay | Admitting: Internal Medicine

## 2012-12-03 MED ORDER — FLUTICASONE PROPIONATE HFA 110 MCG/ACT IN AERO: INHALATION_SPRAY | RESPIRATORY_TRACT | Status: DC

## 2012-12-03 NOTE — Telephone Encounter (Signed)
RX has been sent to the pharmacy. Nothing further needed 

## 2013-03-06 ENCOUNTER — Ambulatory Visit (INDEPENDENT_AMBULATORY_CARE_PROVIDER_SITE_OTHER): Payer: PRIVATE HEALTH INSURANCE | Admitting: Internal Medicine

## 2013-03-06 ENCOUNTER — Encounter: Payer: Self-pay | Admitting: Internal Medicine

## 2013-03-06 VITALS — BP 142/60 | HR 64 | Ht 62.75 in | Wt 143.4 lb

## 2013-03-06 DIAGNOSIS — J329 Chronic sinusitis, unspecified: Secondary | ICD-10-CM

## 2013-03-06 DIAGNOSIS — J45909 Unspecified asthma, uncomplicated: Secondary | ICD-10-CM

## 2013-03-06 NOTE — Progress Notes (Signed)
03/03/11- 81 yoF never smoker followed for asthma, allergic rhinitis LOV-03/15/2010 She says this was a good year except that for the last few weeks she's had increased head congestion with nasal drainage and some yellow mucus. Nasal congestion causes her to sleep upright at times. Denies fever, sore throat, frankly purulent bloody sputum, chest congestion or wheeze.  03/06/12- 82 yoF never smoker followed for asthma, allergic rhinitis FOLLOWS FOR: breathing has improved. cough has resolved. congestion has resolved as well. The prednisone for chest congestion but is now back to baseline says blood pressure is up a little today because she got lost coming here. No routine cough and rare need for her inhaler.  03/06/13-83 yoF never smoker followed for asthma, allergic rhinitis Follows For: Prod cough (green) - SOB and wheezing - Sinus drainage (clear) - Denies fever Acute illness-10 days,green sputum, improving. Rescue inhaler helps. CXR 03/06/12 IMPRESSION:  Hyperinflation, without acute finding.  Original Report Authenticated By: Jeronimo GreavesKyle Talbot, M.D.  ROS-see HPI Constitutional:   No-   weight loss, night sweats, fevers, chills, fatigue, lassitude. HEENT:   No-  headaches, difficulty swallowing, tooth/dental problems, sore throat,       No-  sneezing, itching, ear ache,   nasal congestion, +post nasal drip,  CV:  No-   chest pain, orthopnea, PND, swelling in lower extremities, anasarca,  dizziness, palpitations Resp: No- acute  shortness of breath with exertion or at rest.              + productive cough,  No non-productive cough,  No- coughing up of blood.             +change in color of mucus.  No- wheezing.   Skin: No-   rash or lesions. GI:  No-   heartburn, indigestion, abdominal pain, nausea, vomiting,  GU:  MS:  No-   joint pain or swelling.  . Neuro-     nothing unusual Psych:  No- change in mood or affect. No depression or anxiety.  No memory loss.  OBJ General- Alert, Oriented,  Affect-appropriate, Distress- none acute Skin- rash-none, lesions- none, excoriation- none Lymphadenopathy- none Head- atraumatic            Eyes- Gross vision intact, PERRLA, conjunctivae clear secretions            Ears- Hearing, canals-normal            Nose- sniffing,+ turbinate edema, no-Septal dev, mucus, polyps, erosion, perforation             Throat- Mallampati II , mucosa normal , drainage+ mucoid post nasal drip, tonsils- atrophic. Mild hoarseness Neck- flexible , trachea midline, no stridor , thyroid nl, carotid no bruit Chest - symmetrical excursion , unlabored           Heart/CV- RRR , no murmur , no gallop  , no rub, nl s1 s2                           - JVD- none , edema- none, stasis changes- none, varices- none           Lung- +coarse sounds, unlabored, wheeze- none, cough- none , dullness-none, rub- none           Chest wall-  Abd- Br/ Gen/ Rectal- Not done, not indicated Extrem- cyanosis- none, clubbing, none, atrophy- none, strength- nl Neuro- grossly intact to observation

## 2013-03-06 NOTE — Patient Instructions (Signed)
Hopefully this acute bronchitis will clear on its own now. If you get worse, or need our help, then please call.

## 2013-03-30 NOTE — Assessment & Plan Note (Signed)
Postnasal drip without obvious purulent sinusitis now

## 2013-03-30 NOTE — Assessment & Plan Note (Signed)
Acute exacerbation of bronchitis Plan-supportive care as she continues to improve

## 2014-03-11 ENCOUNTER — Ambulatory Visit: Payer: PRIVATE HEALTH INSURANCE | Admitting: Internal Medicine

## 2014-04-16 ENCOUNTER — Encounter: Payer: Self-pay | Admitting: Internal Medicine

## 2014-04-16 ENCOUNTER — Ambulatory Visit (INDEPENDENT_AMBULATORY_CARE_PROVIDER_SITE_OTHER): Payer: Medicare HMO | Admitting: Internal Medicine

## 2014-04-16 VITALS — BP 130/78 | HR 63 | Ht 62.75 in | Wt 148.6 lb

## 2014-04-16 DIAGNOSIS — J309 Allergic rhinitis, unspecified: Secondary | ICD-10-CM

## 2014-04-16 DIAGNOSIS — J4541 Moderate persistent asthma with (acute) exacerbation: Secondary | ICD-10-CM

## 2014-04-16 DIAGNOSIS — J3089 Other allergic rhinitis: Secondary | ICD-10-CM

## 2014-04-16 DIAGNOSIS — J302 Other seasonal allergic rhinitis: Secondary | ICD-10-CM

## 2014-04-16 MED ORDER — AZITHROMYCIN 250 MG PO TABS: ORAL_TABLET | ORAL | Status: DC

## 2014-04-16 NOTE — Progress Notes (Signed)
03/03/11- 81 yoF never smoker followed for asthma, allergic rhinitis LOV-03/15/2010 She says this was a good year except that for the last few weeks she's had increased head congestion with nasal drainage and some yellow mucus. Nasal congestion causes her to sleep upright at times. Denies fever, sore throat, frankly purulent bloody sputum, chest congestion or wheeze.  03/06/12- 82 yoF never smoker followed for asthma, allergic rhinitis FOLLOWS FOR: breathing has improved. cough has resolved. congestion has resolved as well. The prednisone for chest congestion but is now back to baseline says blood pressure is up a little today because she got lost coming here. No routine cough and rare need for her inhaler.  03/06/13-83 yoF never smoker followed for asthma, allergic rhinitis Follows For: Prod cough (green) - SOB and wheezing - Sinus drainage (clear) - Denies fever Acute illness-10 days,green sputum, improving. Rescue inhaler helps. CXR 03/06/12 IMPRESSION:  Hyperinflation, without acute finding.  Original Report Authenticated By: Jeronimo Greaves, M.D.  04/16/14- 61 yoF never smoker followed for asthma, allergic rhinitis FOLLOWS FOR: Cough x 2-3 days-green and yellow in color. Had wheezing as well-took Delsym cough syrup per nephew that is an MD suggested and this has helped with wheezing. She describes 2 weeks of increasing cough with sputum turning green but no fever. Does not feel toxic. Did have some wheeze but Delsym helped. Continues albuterol used occasionally, Flovent inhaler  ROS-see HPI Constitutional:   No-   weight loss, night sweats, fevers, chills, fatigue, lassitude. HEENT:   No-  headaches, difficulty swallowing, tooth/dental problems, sore throat,       No-  sneezing, itching, ear ache,   nasal congestion, +post nasal drip,  CV:  No-   chest pain, orthopnea, PND, swelling in lower extremities, anasarca,  dizziness, palpitations Resp: No- acute  shortness of breath with exertion or at  rest.              + productive cough,  No non-productive cough,  No- coughing up of blood.             +change in color of mucus.  No- wheezing.   Skin: No-   rash or lesions. GI:  No-   heartburn, indigestion, abdominal pain, nausea, vomiting,  GU:  MS:  No-   joint pain or swelling.  . Neuro-     nothing unusual Psych:  No- change in mood or affect. No depression or anxiety.  No memory loss.  OBJ General- Alert, Oriented, Affect-appropriate, Distress- none acute Skin- rash-none, lesions- none, excoriation- none Lymphadenopathy- none Head- atraumatic            Eyes- Gross vision intact, PERRLA, conjunctivae clear secretions            Ears- Hearing, canals-normal            Nose- sniffing,+ turbinate edema, no-Septal dev, mucus, polyps, erosion, perforation             Throat- Mallampati II , mucosa normal , drainage-none, tonsils- atrophic. Mild hoarseness Neck- flexible , trachea midline, no stridor , thyroid nl, carotid no bruit Chest - symmetrical excursion , unlabored           Heart/CV- RRR , no murmur , no gallop  , no rub, nl s1 s2                           - JVD- none , edema- none, stasis changes- none, varices- none  Lung- +coarse sounds, unlabored, wheeze- none, cough- none , dullness-none, rub- none           Chest wall-  Abd- Br/ Gen/ Rectal- Not done, not indicated Extrem- cyanosis- none, clubbing, none, atrophy- none, strength- nl Neuro- grossly intact to observation

## 2014-04-16 NOTE — Patient Instructions (Signed)
Script sent for Z pak antibiotic  Please call if we can help

## 2014-04-18 NOTE — Assessment & Plan Note (Signed)
Acute exacerbation, organism not specified but likely bacterial at this point. Plan-Z-Pak, fluids. Discussed supportive care.

## 2014-04-18 NOTE — Assessment & Plan Note (Signed)
We discussed upcoming spring pollen season and available tools.

## 2014-08-26 ENCOUNTER — Telehealth: Payer: Self-pay | Admitting: Internal Medicine

## 2014-08-26 MED ORDER — DOXYCYCLINE HYCLATE 100 MG PO TABS: ORAL_TABLET | ORAL | Status: DC

## 2014-08-26 NOTE — Telephone Encounter (Signed)
Offer doxycycline 100 mg, # 8, 2 today then one daily 

## 2014-08-26 NOTE — Telephone Encounter (Signed)
Pt aware of recs. rx sent in. Nothing further needed 

## 2014-08-26 NOTE — Telephone Encounter (Signed)
Called spoke with pt. C/o prod cough (yellow-green phlem), nasal cong, PND. No f/c/s/n/v. Not taking anything. Please advise Dr. Maple HudsonYoung thanks  Allergies  Allergen Reactions  . Aspirin   . Diclofenac Sodium   . Levofloxacin   . Lisinopril   . Metformin And Related Diarrhea  . Niacin   . Ofloxacin     REACTION: angioedema    Current Outpatient Prescriptions on File Prior to Visit  Medication Sig Dispense Refill  . albuterol (PROAIR HFA) 108 (90 BASE) MCG/ACT inhaler Inhale 2 puffs into the lungs 4 (four) times daily as needed for wheezing or shortness of breath. 3 Inhaler 3  . azithromycin (ZITHROMAX) 250 MG tablet 2 today then one daily 6 tablet 0  . carboxymethylcellulose (REFRESH PLUS) 0.5 % SOLN 1 drop 2 (two) times daily.    . fluticasone (FLOVENT HFA) 110 MCG/ACT inhaler 2 puffs and rinse mouth, twice daily 3 Inhaler 1  . travoprost, benzalkonium, (TRAVATAN) 0.004 % ophthalmic solution Place 1 drop into the left eye at bedtime.     No current facility-administered medications on file prior to visit.

## 2015-02-05 ENCOUNTER — Encounter: Payer: Self-pay | Admitting: Internal Medicine

## 2015-02-05 ENCOUNTER — Ambulatory Visit (INDEPENDENT_AMBULATORY_CARE_PROVIDER_SITE_OTHER): Payer: Medicare HMO | Admitting: Internal Medicine

## 2015-02-05 VITALS — BP 120/78 | HR 66 | Ht 62.75 in | Wt 152.0 lb

## 2015-02-05 DIAGNOSIS — R0602 Shortness of breath: Secondary | ICD-10-CM

## 2015-02-05 DIAGNOSIS — J4541 Moderate persistent asthma with (acute) exacerbation: Secondary | ICD-10-CM

## 2015-02-05 MED ORDER — LEVALBUTEROL HCL 0.63 MG/3ML IN NEBU
0.6300 mg | INHALATION_SOLUTION | Freq: Once | RESPIRATORY_TRACT | Status: AC
  Administered 2015-02-05: 0.63 mg via RESPIRATORY_TRACT

## 2015-02-05 MED ORDER — AZITHROMYCIN 250 MG PO TABS: ORAL_TABLET | ORAL | Status: DC

## 2015-02-05 MED ORDER — PREDNISONE 10 MG PO TABS: ORAL_TABLET | ORAL | Status: DC

## 2015-02-05 NOTE — Progress Notes (Signed)
03/03/11- 81 yoF never smoker followed for asthma, allergic rhinitis LOV-03/15/2010 She says this was a good year except that for the last few weeks she's had increased head congestion with nasal drainage and some yellow mucus. Nasal congestion causes her to sleep upright at times. Denies fever, sore throat, frankly purulent bloody sputum, chest congestion or wheeze.  03/06/12- 82 yoF never smoker followed for asthma, allergic rhinitis FOLLOWS FOR: breathing has improved. cough has resolved. congestion has resolved as well. The prednisone for chest congestion but is now back to baseline says blood pressure is up a little today because she got lost coming here. No routine cough and rare need for her inhaler.  03/06/13-83 yoF never smoker followed for asthma, allergic rhinitis Follows For: Prod cough (green) - SOB and wheezing - Sinus drainage (clear) - Denies fever Acute illness-10 days,green sputum, improving. Rescue inhaler helps. CXR 03/06/12 IMPRESSION:  Hyperinflation, without acute finding.  Original Report Authenticated By: Jeronimo GreavesKyle Talbot, M.D.  04/16/14- [age] yoF never smoker followed for asthma, allergic rhinitis FOLLOWS FOR: Cough x 2-3 days-green and yellow in color. Had wheezing as well-took Delsym cough syrup per nephew that is an MD suggested and this has helped with wheezing. She describes 2 weeks of increasing cough with sputum turning green but no fever. Does not feel toxic. Did have some wheeze but Delsym helped. Continues albuterol used occasionally, Flovent inhaler  02/05/2015-81 year old female never smoker followed for asthma/bronchitis, allergic rhinitis, complicated by GERD, glaucoma, DM 2 ACUTE Pt states she is hurting in center of chest, wheezing, SOB as well.  Pt has used Proair without relief. Flu vac UTD Sick 2 weeks with shortness of breath raspy/wheezy cough, scant yellow or green phlegm. No recognized fever or distinct chills. Pleuritic soreness mid chest anteriorly. No  swelling or discomfort in legs. Se[facility]/03/2015-records reviewed in Care Everywhere: Diagnosed acute bronchitis CTA chest showed bronchial thickening, some groundglass and mucous plugs also question hydronephrosis, not well visualized.Marland Kitchen. No PE. Being that showed potassium 3.3 with creatinine 0.86, WBC 19,100. She was given Rocephin injection and started on Omnicef. Denies GI upset/reflux.  ROS-see HPI Constitutional:   No-   weight loss, night sweats, fevers, chills, fatigue, lassitude. HEENT:   No-  headaches, difficulty swallowing, tooth/dental problems, sore throat,       No-  sneezing, itching, ear ache,   nasal congestion, +post nasal drip,  CV:  + chest pain, orthopnea, PND, swelling in lower extremities, anasarca,  dizziness, palpitations Resp: No- acute  shortness of breath with exertion or at rest.              + productive cough,  No non-productive cough,  No- coughing up of blood.             +change in color of mucus.  No- wheezing.   Skin: No-   rash or lesions. GI:  No-   heartburn, indigestion, abdominal pain, nausea, vomiting,  GU:  MS:  No-   joint pain or swelling.  . Neuro-     nothing unusual Psych:  No- change in mood or affect. No depression or anxiety.  No memory loss.  OBJ General- Alert, Oriented, Affect-appropriate, Distress- none acute  room air saturation 88-92% \Skin- rash-none, lesions- none, excoriation- none Lymphadenopathy- none Head- atraumatic            Eyes- Gross vision intact, PERRLA, conjunctivae clear secretions            Ears- Hearing, canals-normal  Nose- sniffing,+ turbinate edema, no-Septal dev, mucus, polyps, erosion, perforation             Throat- Mallampati II , mucosa normal , drainage-none, tonsils- atrophic. Mild hoarseness Neck- flexible , trachea midline, no stridor , thyroid nl, carotid no bruit Chest - symmetrical excursion , unlabored           Heart/CV- RRR , no murmur , no gallop  , no rub, nl  s1 s2                           - JVD- none , edema- none, stasis changes- none, varices- none           Lung- +coarse sounds, unlabored, wheeze+, cough+ , dullness-none, rub- none           Chest wall-  Abd- Br/ Gen/ Rectal- Not done, not indicated Extrem- cyanosis- none, clubbing, none, atrophy- none, strength- nl Neuro- grossly intact to observation

## 2015-02-05 NOTE — Patient Instructions (Addendum)
Script sent to add Zpak antibiotic for broader coverage, while you continue your cefdinir antibiotic  Script sent for prednisone taper       This will raise you blood sugar while you are on it, so be diligent with your diabetes medicines.  Fluids, rest and stay warm  If you get worse this weekend, go back to your hospital  Neb xop 0.63  A probiotic might help your stomach while on antibiotics - live culture yogurt like Activia, or Florastor or Align   Keep February appointment  Ask your primary doctor about the questionable kidney abnormality seen on the [facility] CT

## 2015-02-05 NOTE — Assessment & Plan Note (Signed)
Acute exacerbation. I would like to broaden coverage by adding Zithromax to her Omnicef. Plan-add Zithromax. Add prednisone with caution that this will raise her blood sugar if gets worse, go to local emergency room.

## 2015-03-09 ENCOUNTER — Telehealth: Payer: Self-pay | Admitting: Internal Medicine

## 2015-03-09 MED ORDER — PREDNISONE 10 MG PO TABS: ORAL_TABLET | ORAL | Status: DC

## 2015-03-09 NOTE — Telephone Encounter (Signed)
Offer prednisone 10 mg, # 20, 4 X 2 DAYS, 3 X 2 DAYS, 2 X 2 DAYS, 1 X 2 DAYS  

## 2015-03-09 NOTE — Telephone Encounter (Signed)
Spoke with pt's niece Kathleen Conrad- pt gave verbal ok to speak to niece on the phone.  Pt c/o increased shortness of breath, wheezing, prod cough with yellow mucus since yesterday.  Denies fever, chest pain, sinus congestion.   Pt has not taken anything to help with s/s.  [facility] [place]  [place] please advise.  Thanks.

## 2015-03-09 NOTE — Telephone Encounter (Signed)
Called and spoke with [name]. I reviewed recs and verified pharmacy [facility] [place]. [name] voiced understanding and had no further questions. Rx sent. Nothing further needed.

## 2015-03-09 NOTE — Addendum Note (Signed)
Addended by: Karalee HeightOX, Ether Wolters P on: 03/09/2015 05:16 PM   Modules accepted: Orders

## 2015-04-21 ENCOUNTER — Ambulatory Visit: Payer: Medicare HMO | Admitting: Internal Medicine

## 2015-05-28 ENCOUNTER — Telehealth: Payer: Self-pay | Admitting: Internal Medicine

## 2015-05-28 MED ORDER — PREDNISONE 10 MG PO TABS: ORAL_TABLET | ORAL | Status: DC

## 2015-05-28 NOTE — Telephone Encounter (Signed)
Patients caretaker calling to see if Dr. Maple HudsonYoung will call patient in some prednisone.  She is coughing up yellow phlegm.  Chest tightness. Caretaker, [name], wants to get her on something before the weekend.    Pharmacy:  [facility] [place]  Allergies  Allergen Reactions  . Aspirin   . Diclofenac Sodium   . Levofloxacin   . Lisinopril   . Metformin And Related Diarrhea  . Niacin   . Ofloxacin     REACTION: angioedema   Current Outpatient Prescriptions on File Prior to Visit  Medication Sig Dispense Refill  . albuterol (PROAIR HFA) 108 (90 BASE) MCG/ACT inhaler Inhale 2 puffs into the lungs 4 (four) times daily as needed for wheezing or shortness of breath. 3 Inhaler 3  . amLODipine (NORVASC) 10 MG tablet Take 1 tablet by mouth daily.    Marland Kitchen. atorvastatin (LIPITOR) 10 MG tablet Take 10 mg by mouth daily.    Marland Kitchen. azithromycin (ZITHROMAX) 250 MG tablet 2 today then one daily 6 each 0  . carboxymethylcellulose (REFRESH PLUS) 0.5 % SOLN 1 drop 2 (two) times daily.    Marland Kitchen. glipiZIDE (GLUCOTROL XL) 10 MG 24 hr tablet Take 1 tablet by mouth 2 (two) times daily.    Marland Kitchen. loperamide (IMODIUM) 1 MG/5ML solution Take by mouth as needed for diarrhea or loose stools.    . metoprolol (LOPRESSOR) 50 MG tablet Take 1 tablet by mouth 2 (two) times daily.    . predniSONE (DELTASONE) 10 MG tablet Take 4 tabs X 2 DAYS, 3 tabs X 2 DAYS, 2 tabs X 2 DAYS, 1 tab X 2 DAYS  20 tablet 0  . predniSONE (DELTASONE) 10 MG tablet 4 X 2 DAYS, 3 X 2 DAYS, 2 X 2 DAYS, 1 X 2 DAYS 20 tablet 0  . travoprost, benzalkonium, (TRAVATAN) 0.004 % ophthalmic solution Place 1 drop into the left eye at bedtime.     No current facility-administered medications on file prior to visit.

## 2015-05-28 NOTE — Telephone Encounter (Signed)
Off prednisone 10 mg #7, 2 daily 2 days then 1 daily

## 2015-05-28 NOTE — Telephone Encounter (Signed)
Called spoke with [name] and is aware of recs. RX has been sent in. Nothing further needed

## 2015-06-28 ENCOUNTER — Ambulatory Visit (INDEPENDENT_AMBULATORY_CARE_PROVIDER_SITE_OTHER): Payer: Medicare HMO | Admitting: Internal Medicine

## 2015-06-28 ENCOUNTER — Encounter: Payer: Self-pay | Admitting: Internal Medicine

## 2015-06-28 ENCOUNTER — Other Ambulatory Visit: Payer: Medicare HMO

## 2015-06-28 VITALS — BP 128/60 | HR 54 | Ht 62.75 in | Wt 154.8 lb

## 2015-06-28 DIAGNOSIS — K219 Gastro-esophageal reflux disease without esophagitis: Secondary | ICD-10-CM | POA: Diagnosis not present

## 2015-06-28 DIAGNOSIS — J479 Bronchiectasis, uncomplicated: Secondary | ICD-10-CM

## 2015-06-28 DIAGNOSIS — J42 Unspecified chronic bronchitis: Secondary | ICD-10-CM | POA: Diagnosis not present

## 2015-06-28 DIAGNOSIS — J4540 Moderate persistent asthma, uncomplicated: Secondary | ICD-10-CM

## 2015-06-28 MED ORDER — FLUTICASONE FUROATE-VILANTEROL 100-25 MCG/INH IN AEPB: 1.0000 | INHALATION_SPRAY | Freq: Every day | RESPIRATORY_TRACT | Status: DC

## 2015-06-28 NOTE — Patient Instructions (Addendum)
Sample Breo 100    Inhale 1 puff, then rinse mouth, once daily  Order- lab- sputum C&S- routine, fungal and AFB      Dx chronic bronchitis  Order- CT chest, no contrast, [facility]    Dx bronchiectasis, chronic bronchitis  Suggest- elevate head of bed by putting 1 brick under each of the head legs. This can help reduce reflux and possible aspiration of stomach juice during the night.  Please call as needed

## 2015-06-28 NOTE — Progress Notes (Signed)
03/03/11- 81 yoF never smoker followed for asthma, allergic rhinitis LOV-03/15/2010 She says this was a good year except that for the last few weeks she's had increased head congestion with nasal drainage and some yellow mucus. Nasal congestion causes her to sleep upright at times. Denies fever, sore throat, frankly purulent bloody sputum, chest congestion or wheeze.  03/06/12- 82 yoF never smoker followed for asthma, allergic rhinitis FOLLOWS FOR: breathing has improved. cough has resolved. congestion has resolved as well. The prednisone for chest congestion but is now back to baseline says blood pressure is up a little today because she got lost coming here. No routine cough and rare need for her inhaler.  03/06/13-82 yoF never smoker followed for asthma, allergic rhinitis Follows For: Prod cough (green) - SOB and wheezing - Sinus drainage (clear) - Denies fever Acute illness-10 days,green sputum, improving. Rescue inhaler helps. CXR 03/06/12 IMPRESSION:  Hyperinflation, without acute finding.  Original Report Authenticated By: Jeronimo Greaves, M.D.  04/16/14- 82 yoF never smoker followed for asthma, allergic rhinitis FOLLOWS FOR: Cough x 2-3 days-green and yellow in color. Had wheezing as well-took Delsym cough syrup per nephew that is an MD suggested and this has helped with wheezing. She describes 2 weeks of increasing cough with sputum turning green but no fever. Does not feel toxic. Did have some wheeze but Delsym helped. Continues albuterol used occasionally, Flovent inhaler  02/05/2015-82 year old female never smoker followed for asthma/bronchitis, allergic rhinitis, complicated by GERD, glaucoma, DM 2 ACUTE Pt states she is hurting in center of chest, wheezing, SOB as well.  Pt has used Proair without relief. Flu vac UTD Sick 2 weeks with shortness of breath raspy/wheezy cough, scant yellow or green phlegm. No recognized fever or distinct chills. Pleuritic soreness mid chest anteriorly. No  swelling or discomfort in legs. Seen at [facility] ER 02/03/2015-records reviewed in Care Everywhere: Diagnosed acute bronchitis CTA chest showed bronchial thickening, some groundglass and mucous plugs also question hydronephrosis, not well visualized.Marland Kitchen No PE. BMET showed potassium 3.3 with creatinine 0.86, WBC 19,100. She was given Rocephin injection and started on Omnicef. Denies GI upset/reflux.  06/28/2015-82 year old female never smoker followed for asthma/bronchitis, allergic rhinitis, complicated by GERD, glaucoma, DM 2 FOLLOWS FOR: Neice states she took patient 3 different times to MD close to home-was told Bronchitis-not getting any better; ? fever one da, cough-productive-yellow in color. Neice brought in CT Scan done around Christmas to review as well. Pt was given abx's and prednisone,. Finished 8 or 9 days ago. Not acutely ill now but keeps a feeling of chest rattle. We discussed her CT scan again from December, and best ways to follow-up on this. She has a flutter device but not using it. Occasional use of rescue inhaler.  ROS-see HPI Constitutional:   No-   weight loss, night sweats, fevers, chills, fatigue, lassitude. HEENT:   No-  headaches, difficulty swallowing, tooth/dental problems, sore throat,       No-  sneezing, itching, ear ache,   nasal congestion, +post nasal drip,  CV:  + chest pain, orthopnea, PND, swelling in lower extremities, anasarca,  dizziness, palpitations Resp: No- acute  shortness of breath with exertion or at rest.              + productive cough,  No non-productive cough,  No- coughing up of blood.             +change in color of mucus.  No- wheezing.   Skin: No-   rash or lesions.  GI:  No-   heartburn, indigestion, abdominal pain, nausea, vomiting,  GU:  MS:  No-   joint pain or swelling.  . Neuro-     nothing unusual Psych:  No- change in mood or affect. No depression or anxiety.  No memory loss.  OBJ General- Alert, Oriented,  Affect-appropriate, Distress- none acute  room air saturation 88-92% \Skin- rash-none, lesions- none, excoriation- none Lymphadenopathy- none Head- atraumatic            Eyes- Gross vision intact, PERRLA, conjunctivae clear secretions            Ears- Hearing, canals-normal            Nose- sniffing,+ turbinate edema, no-Septal dev, mucus, polyps, erosion, perforation             Throat- Mallampati II , mucosa normal , drainage-none, tonsils- atrophic. Mild hoarseness Neck- flexible , trachea midline, no stridor , thyroid nl, carotid no bruit Chest - symmetrical excursion , unlabored           Heart/CV- RRR , no murmur , no gallop  , no rub, nl s1 s2                           - JVD- none , edema- none, stasis changes- none, varices- none           Lung- +coarse sounds, unlabored, wheeze+, cough+ , dullness-none, rub- none           Chest wall-  Abd- Br/ Gen/ Rectal- Not done, not indicated Extrem- cyanosis- none, clubbing, none, atrophy- none, strength- nl Neuro- grossly intact to observation

## 2015-06-29 DIAGNOSIS — K219 Gastro-esophageal reflux disease without esophagitis: Secondary | ICD-10-CM | POA: Insufficient documentation

## 2015-06-29 NOTE — Assessment & Plan Note (Signed)
Not acutely ill but never really clear. Pattern is suggestive of recurrent acute bronchitis versus bronchiectasis. She admits GERD symptoms and probably refluxes intermittently. Plan-repeat CT scan at [facility] to compare with December, looking for progression of groundglass and irregular densities. Some of this probably bronchitis and mucous plugging. She is given sample maintenance inhaler and encouraged to use her Flutter device regularly as explained.

## 2015-06-29 NOTE — Assessment & Plan Note (Signed)
Highly likely that she is intermittently aspirating. Educated on reflux precautions including elevation head of bed. Use flutter device.

## 2015-07-01 LAB — RESPIRATORY CULTURE OR RESPIRATORY AND SPUTUM CULTURE: ORGANISM ID, BACTERIA: NORMAL

## 2015-08-09 ENCOUNTER — Telehealth: Payer: Self-pay | Admitting: Internal Medicine

## 2015-08-09 MED ORDER — PREDNISONE 10 MG PO TABS: ORAL_TABLET | ORAL | Status: DC

## 2015-08-09 MED ORDER — FLUTICASONE FUROATE-VILANTEROL 100-25 MCG/INH IN AEPB: 1.0000 | INHALATION_SPRAY | Freq: Every day | RESPIRATORY_TRACT | Status: DC

## 2015-08-09 NOTE — Telephone Encounter (Signed)
[name] called back on patient behalf. She can be reached on her cell [phone]

## 2015-08-09 NOTE — Telephone Encounter (Signed)
Spoke with Kathleen Conrad and notified of recs per CDY  She verbalized understanding  I have sent this and also breo per her request  Nothing further needed

## 2015-08-09 NOTE — Telephone Encounter (Signed)
LMTCB

## 2015-08-09 NOTE — Telephone Encounter (Signed)
Spoke with Kathleen Conrad. Reports that pt has a cough and a "rattling" in her chest. Cough is producing very little mucus. Denies chest tightness, SOB or fever. Symptoms started 3 days ago. She ran out of the sample of Virgel BouquetBreo that was given to her. Kathleen Conrad Kathleen Conrad that they should have called Kathleen Conrad when this happened. Kathleen Conrad believes that the pt needs prednisone. CY - please advise. Thanks.  Allergies  Allergen Reactions  . Aspirin   . Diclofenac Sodium   . Levofloxacin   . Lisinopril   . Metformin And Related Diarrhea  . Niacin   . Ofloxacin     REACTION: angioedema   Current Outpatient Prescriptions on File Prior to Visit  Medication Sig Dispense Refill  . albuterol (PROAIR HFA) 108 (90 BASE) MCG/ACT inhaler Inhale 2 puffs into the lungs 4 (four) times daily as needed for wheezing or shortness of breath. 3 Inhaler 3  . amLODipine (NORVASC) 10 MG tablet Take 1 tablet by mouth daily.    Marland Kitchen. atorvastatin (LIPITOR) 10 MG tablet Take 10 mg by mouth daily.    . carboxymethylcellulose (REFRESH PLUS) 0.5 % SOLN 1 drop 2 (two) times daily.    . fluticasone furoate-vilanterol (BREO ELLIPTA) 100-25 MCG/INH AEPB Inhale 1 puff into the lungs daily. 1 each 0  . metoprolol (LOPRESSOR) 50 MG tablet Take 1 tablet by mouth 2 (two) times daily.     No current facility-administered medications on file prior to visit.

## 2015-08-09 NOTE — Telephone Encounter (Signed)
Ok to try prednisone 10 mg, # 20, 4 X 2 DAYS, 3 X 2 DAYS, 2 X 2 DAYS, 1 X 2 DAYS

## 2015-08-13 LAB — FUNGUS CULTURE W SMEAR

## 2015-08-13 LAB — AFB CULTURE WITH SMEAR (NOT AT ARMC)

## 2015-11-29 ENCOUNTER — Ambulatory Visit (INDEPENDENT_AMBULATORY_CARE_PROVIDER_SITE_OTHER): Payer: Medicare HMO | Admitting: Internal Medicine

## 2015-11-29 ENCOUNTER — Encounter: Payer: Self-pay | Admitting: Internal Medicine

## 2015-11-29 VITALS — BP 130/50 | HR 57 | Ht 62.75 in | Wt 157.8 lb

## 2015-11-29 DIAGNOSIS — J329 Chronic sinusitis, unspecified: Secondary | ICD-10-CM

## 2015-11-29 DIAGNOSIS — J3089 Other allergic rhinitis: Secondary | ICD-10-CM | POA: Diagnosis not present

## 2015-11-29 DIAGNOSIS — J45909 Unspecified asthma, uncomplicated: Secondary | ICD-10-CM

## 2015-11-29 DIAGNOSIS — J302 Other seasonal allergic rhinitis: Secondary | ICD-10-CM | POA: Diagnosis not present

## 2015-11-29 MED ORDER — AMOXICILLIN-POT CLAVULANATE 500-125 MG PO TABS: ORAL_TABLET | ORAL | 0 refills | Status: DC

## 2015-11-29 MED ORDER — PHENYLEPHRINE HCL 1 % NA SOLN
3.0000 [drp] | Freq: Once | NASAL | Status: AC
  Administered 2015-11-29: 3 [drp] via NASAL

## 2015-11-29 MED ORDER — METHYLPREDNISOLONE ACETATE 80 MG/ML IJ SUSP
80.0000 mg | Freq: Once | INTRAMUSCULAR | Status: AC
Start: 2015-11-29 — End: 2015-11-29
  Administered 2015-11-29: 80 mg via INTRAMUSCULAR

## 2015-11-29 NOTE — Patient Instructions (Addendum)
Neb neo nasal      Dx acute maxillary sinusitis  Depo 80  Script sent for augmentin antibiotic  Please call as needed

## 2015-11-29 NOTE — Progress Notes (Signed)
HPI   F never smoker followed for asthma, allergic rhinitis   02/05/2015-82 year old female never smoker followed for asthma/bronchitis, allergic rhinitis, complicated by GERD, glaucoma, DM 2 ACUTE Pt states she is hurting in center of chest, wheezing, SOB as well.  Pt has used Proair without relief. Flu vac UTD Sick 2 weeks with shortness of breath raspy/wheezy cough, scant yellow or green phlegm. No recognized fever or distinct chills. Pleuritic soreness mid chest anteriorly. No swelling or discomfort in legs. Se[facility] 02/03/2015-records reviewed in Care Everywhere: Diagnosed acute bronchitis CTA chest showed bronchial thickening, some groundglass and mucous plugs also question hydronephrosis, not well visualized.Marland Kitchen. No PE. BMET showed potassium 3.3 with creatinine 0.86, WBC 19,100. She was given Rocephin injection and started on Omnicef. Denies GI upset/reflux.  06/28/2015-82 year old female never smoker followed for asthma/bronchitis, allergic rhinitis, complicated by GERD, glaucoma, DM 2 FOLLOWS FOR: Neice states she took patient 3 different times to MD close to home-was told Bronchitis-not getting any better; ? fever one da, cough-productive-yellow in color. Neice brought in CT Scan done around Christmas to review as well. Pt was given abx's and prednisone,. Finished 8 or 9 days ago. Not acutely ill now but keeps a feeling of chest rattle. We discussed her CT scan again from December, and best ways to follow-up on this. She has a flutter device but not using it. Occasional use of rescue inhaler.  11/29/2015-82 year old female never smoker followed for asthma/bronchitis, allergic rhinitis, complicated by GERD, glaucoma, DM 2 FOLLOWS FOR: Pt is having sinus flare up at this time-has been using OTC Advil Cold and sinus without relief. This has caused her to have SOB and cough; wheezing and tight in chest. For the past week or 2 has had head congestion with postnasal  drainage, green mucus but no headache or fever. Chest tightness and cough with scant light green sputum. Minor sore throat.  ROS-see HPI Constitutional:   No-   weight loss, night sweats, fevers, chills, fatigue, lassitude. HEENT:   No-  headaches, difficulty swallowing, tooth/dental problems, sore throat,       No-  sneezing, itching, ear ache,   nasal congestion, +post nasal drip,  CV:   chest pain, orthopnea, PND, swelling in lower extremities, anasarca,  dizziness, palpitations Resp: No- acute  shortness of breath with exertion or at rest.              + productive cough,  No non-productive cough,  No- coughing up of blood.             +change in color of mucus.  No- wheezing.   Skin: No-   rash or lesions. GI:  No-   heartburn, indigestion, abdominal pain, nausea, vomiting,  GU:  MS:  No-   joint pain or swelling.  . Neuro-     nothing unusual Psych:  No- change in mood or affect. No depression or anxiety.  No memory loss.  OBJ General- Alert, Oriented, Affect-appropriate, Distress- none acute  room air saturation 88-92% \Skin- rash-none, lesions- none, excoriation- none Lymphadenopathy- none Head- atraumatic            Eyes- Gross vision intact, PERRLA, conjunctivae clear secretions            Ears- Hearing, canals-normal            Nose- sniffing,+ turbinate edema, no-Septal dev, mucus, polyps, erosion, perforation             Throat- Mallampati II , mucosa normal ,  drainage-none, tonsils- atrophic. Mild hoarseness Neck- flexible , trachea midline, no stridor , thyroid nl, carotid no bruit Chest - symmetrical excursion , unlabored           Heart/CV- RRR , no murmur , no gallop  , no rub, nl s1 s2                           - JVD- none , edema- none, stasis changes- none, varices- none           Lung- + diminished/clear, unlabored, wheeze-none, cough-none , dullness-none, rub- none           Chest wall-  Abd- Br/ Gen/ Rectal- Not done, not indicated Extrem- cyanosis- none,  clubbing, none, atrophy- none, strength- nl Neuro- grossly intact to observation

## 2015-12-02 NOTE — Assessment & Plan Note (Signed)
Acute exacerbation maxillary sinusitis Plan-nasal nebulizer decongestant, Depo-Medrol, Augmentin 10 days

## 2015-12-02 NOTE — Assessment & Plan Note (Signed)
Acute exacerbation Plan-Augmentin

## 2015-12-02 NOTE — Assessment & Plan Note (Signed)
Can't tell if she is having some seasonal allergic rhinitis but I think most of the current issue is likely infection

## 2016-01-03 ENCOUNTER — Telehealth: Payer: Self-pay | Admitting: Internal Medicine

## 2016-01-03 MED ORDER — AZITHROMYCIN 250 MG PO TABS: ORAL_TABLET | ORAL | 0 refills | Status: AC

## 2016-01-03 NOTE — Telephone Encounter (Signed)
Spoke with [name] who is not on pt DPR. [name] states she is about 2 miles from pt house and will call us back when she gets there. Will await call back.

## 2016-01-03 NOTE — Telephone Encounter (Signed)
Rx sent to preferred pharmacy. Pt aware & voiced understanding. Nothing further needed.  

## 2016-01-03 NOTE — Telephone Encounter (Signed)
Offer Z pak and suggest Mucinex-DM otc 

## 2016-01-03 NOTE — Telephone Encounter (Signed)
[name] returned phone call.Charm RingsErica R Taylor

## 2016-01-03 NOTE — Telephone Encounter (Signed)
Spoke with pt who c/o prod cough with yellow, mild wheezing, increased sob & chest discomfort. Pt denies any fever, chills or sweats X1wk. Pt states she hasn't taking any OTC medications. Pt would like recommendations.   CY please advise. Thanks.  Current Outpatient Prescriptions on File Prior to Visit  Medication Sig Dispense Refill  . albuterol (PROAIR HFA) 108 (90 BASE) MCG/ACT inhaler Inhale 2 puffs into the lungs 4 (four) times daily as needed for wheezing or shortness of breath. 3 Inhaler 3  . amLODipine (NORVASC) 10 MG tablet Take 1 tablet by mouth daily.    Marland Kitchen. amoxicillin-clavulanate (AUGMENTIN) 500-125 MG tablet 1 twice daily 20 tablet 0  . atorvastatin (LIPITOR) 10 MG tablet Take 10 mg by mouth daily.    . carboxymethylcellulose (REFRESH PLUS) 0.5 % SOLN 1 drop 2 (two) times daily.    . fluticasone furoate-vilanterol (BREO ELLIPTA) 100-25 MCG/INH AEPB Inhale 1 puff into the lungs daily. 1 each 5  . metoprolol (LOPRESSOR) 50 MG tablet Take 1 tablet by mouth 2 (two) times daily.    . predniSONE (DELTASONE) 10 MG tablet 4 x 2 days, 3 x 2 days, 2 x 2 days, 1 x 2 days, then stop 20 tablet 0   No current facility-administered medications on file prior to visit.    Allergies  Allergen Reactions  . Aspirin   . Diclofenac Sodium   . Levofloxacin   . Lisinopril   . Metformin And Related Diarrhea  . Niacin   . Ofloxacin     REACTION: angioedema

## 2016-01-24 ENCOUNTER — Telehealth: Payer: Self-pay | Admitting: Internal Medicine

## 2016-01-24 MED ORDER — AZITHROMYCIN 250 MG PO TABS: ORAL_TABLET | ORAL | 0 refills | Status: DC

## 2016-01-24 NOTE — Telephone Encounter (Signed)
Pt aware that zpak as has been called into pharmacy. Nothing further needed.

## 2016-01-24 NOTE — Telephone Encounter (Signed)
Offer Z pak    # 6, 2 today then one daily 

## 2016-01-24 NOTE — Telephone Encounter (Signed)
Spoke with Bjorn Loserhonda (caregiver), sinus infection/pressure, eyes are watering, little head congestion and cough x 3 days.  C/o Body aches and chest tightness. Denies fever.  Used Mucinex for symptoms in the beginning but feels that she needs an antibiotic.  [facility] [address] [place]  Allergies  Allergen Reactions  . Aspirin   . Diclofenac Sodium   . Levofloxacin   . Lisinopril   . Metformin And Related Diarrhea  . Niacin   . Ofloxacin     REACTION: angioedema     Medication List       Accurate as of 01/24/16  9:12 AM. Always use your most recent med list.          albuterol 108 (90 Base) MCG/ACT inhaler Commonly known as:  PROAIR HFA Inhale 2 puffs into the lungs 4 (four) times daily as needed for wheezing or shortness of breath.   amLODipine 10 MG tablet Commonly known as:  NORVASC Take 1 tablet by mouth daily.   amoxicillin-clavulanate 500-125 MG tablet Commonly known as:  AUGMENTIN 1 twice daily   atorvastatin 10 MG tablet Commonly known as:  LIPITOR Take 10 mg by mouth daily.   carboxymethylcellulose 0.5 % Soln Commonly known as:  REFRESH PLUS 1 drop 2 (two) times daily.   fluticasone furoate-vilanterol 100-25 MCG/INH Aepb Commonly known as:  BREO ELLIPTA Inhale 1 puff into the lungs daily.   metoprolol 50 MG tablet Commonly known as:  LOPRESSOR Take 1 tablet by mouth 2 (two) times daily.   predniSONE 10 MG tablet Commonly known as:  DELTASONE 4 x 2 days, 3 x 2 days, 2 x 2 days, 1 x 2 days, then stop

## 2016-03-02 ENCOUNTER — Encounter: Payer: Self-pay | Admitting: Internal Medicine

## 2016-03-02 ENCOUNTER — Ambulatory Visit (INDEPENDENT_AMBULATORY_CARE_PROVIDER_SITE_OTHER): Payer: Medicare HMO | Admitting: Internal Medicine

## 2016-03-02 DIAGNOSIS — J4530 Mild persistent asthma, uncomplicated: Secondary | ICD-10-CM | POA: Diagnosis not present

## 2016-03-02 DIAGNOSIS — J45909 Unspecified asthma, uncomplicated: Secondary | ICD-10-CM | POA: Diagnosis not present

## 2016-03-02 MED ORDER — DOXYCYCLINE HYCLATE 100 MG PO TABS: ORAL_TABLET | ORAL | 0 refills | Status: DC

## 2016-03-02 MED ORDER — BENZONATATE 200 MG PO CAPS: 200.0000 mg | ORAL_CAPSULE | Freq: Three times a day (TID) | ORAL | 1 refills | Status: DC | PRN

## 2016-03-02 MED ORDER — BUDESONIDE-FORMOTEROL FUMARATE 160-4.5 MCG/ACT IN AERO: 2.0000 | INHALATION_SPRAY | Freq: Two times a day (BID) | RESPIRATORY_TRACT | 6 refills | Status: DC

## 2016-03-02 NOTE — Progress Notes (Signed)
HPI female never smoker followed for asthma/bronchitis, allergic rhinitis, complicated by GERD, glaucoma, DM 2  ---------------------------------------------------  11/29/2015-83 year old female never smoker followed for asthma/bronchitis, allergic rhinitis, complicated by GERD, glaucoma, DM 2 FOLLOWS FOR: Pt is having sinus flare up at this time-has been using OTC Advil Cold and sinus without relief. This has caused her to have SOB and cough; wheezing and tight in chest. For the past week or 2 has had head congestion with postnasal drainage, green mucus but no headache or fever. Chest tightness and cough with scant light green sputum. Minor sore throat.  03/02/2016-83 year old female never smoker followed for asthma/bronchitis, allergic rhinitis, complicated by GERD, glaucoma, DM 2 FOLLOWS FOR: Reports SOB, chest tightness, cough with production of green mucus, sore throat and wheezing. Denies fever. Symptoms started on Sunday. Acute visit-4 days increasing cough with progressive green sputum. Not sure about fever. Initial sore throat. No blood, pain or GI upset. Had both flu and Prevnar vaccines at drugstore November 1. Concerned about cost of Breo and we discussed how to check insurance formulary for preferred alternative.  ROS-see HPI Constitutional:   No-   weight loss, night sweats, +fevers, chills, fatigue, lassitude. HEENT:   No-  headaches, difficulty swallowing, tooth/dental problems, sore throat,       No-  sneezing, itching, ear ache,   nasal congestion, +post nasal drip,  CV:   chest pain, orthopnea, PND, swelling in lower extremities, anasarca,  dizziness, palpitations Resp: No- acute  shortness of breath with exertion or at rest.              + productive cough,  No non-productive cough,  No- coughing up of blood.             +change in color of mucus.  No- wheezing.   Skin: No-   rash or lesions. GI:  No-   heartburn, indigestion, abdominal pain, nausea, vomiting,  GU:  MS:   No-   joint pain or swelling.  . Neuro-     nothing unusual Psych:  No- change in mood or affect. No depression or anxiety.  No memory loss.  OBJ General- Alert, Oriented, Affect-appropriate, Distress-+ mild/unwell Skin- rash-none, lesions- none, excoriation- none, + pale Lymphadenopathy- none Head- atraumatic            Eyes- Gross vision intact, PERRLA, conjunctivae clear secretions            Ears- Hearing, canals-normal            Nose- sniffing,+ turbinate edema, no-Septal dev, mucus, polyps, erosion, perforation             Throat- Mallampati II , mucosa normal , drainage-none, tonsils- atrophic. Mild hoarseness Neck- flexible , trachea midline, no stridor , thyroid nl, carotid no bruit Chest - symmetrical excursion , unlabored           Heart/CV- RRR , no murmur , no gallop  , no rub, nl s1 s2                           - JVD- none , edema- none, stasis changes- none, varices- none           Lung- + mild rattle bilateral, unlabored, wheeze-none, cough-none , dullness-none, rub- none           Chest wall-  Abd- Br/ Gen/ Rectal- Not done, not indicated Extrem- cyanosis- none, clubbing, none, atrophy- none, strength- nl Neuro- grossly intact to  observation

## 2016-03-02 NOTE — Patient Instructions (Signed)
Scripts sent for doxycycline and benzonatate perles for cough  Ok to try otc symptom remedies like Tylenol Cold and Flu. Also try gargling with salt water.Vick's VapoRub, and stay well-hydrated.  Sample Symbiort 160 inhaler   Inhale 2 puffs then rinse mouth, twice daily    Try this instead of Breo.  You can check your insurance formulary to see which will be the cheapest for you this year:  Breo, Symbicort, Dulera, Advair, AirDuo.

## 2016-03-03 NOTE — Assessment & Plan Note (Signed)
Baseline control is generally good. Exacerbations now usually related to acute respiratory infections.

## 2016-03-03 NOTE — Assessment & Plan Note (Signed)
Acute exacerbation asthmatic bronchitis probably began as viral syndrome. Sounds purulent enough now to justify adding antibiotic in this cold weather with weekend coming. Plan-doxycycline, fluids and supportive care

## 2016-03-06 ENCOUNTER — Telehealth: Payer: Self-pay | Admitting: Internal Medicine

## 2016-03-06 MED ORDER — DOXYCYCLINE HYCLATE 100 MG PO TABS: 100.0000 mg | ORAL_TABLET | Freq: Every day | ORAL | 0 refills | Status: DC

## 2016-03-06 NOTE — Telephone Encounter (Signed)
Ok to [employer] doxycycline 100 mg, # 7, 1 daily

## 2016-03-06 NOTE — Telephone Encounter (Signed)
Spoke with pt's healthcare POA, [name]. States that pt is still not feeling 100%. Reports still coughing, SOB, chest tightness, wheezing. Cough is still producing green mucus. Denies fever. Pt saw CY on 03/02/16. We gave her Doxy and Tessalon Perles. [name] feels that pt needs her antibiotic extended. CY - please advise. Thanks.  Allergies  Allergen Reactions  . Aspirin   . Diclofenac Sodium   . Levofloxacin   . Lisinopril   . Metformin And Related Diarrhea  . Niacin   . Ofloxacin     REACTION: angioedema   Current Outpatient Prescriptions on File Prior to Visit  Medication Sig Dispense Refill  . albuterol (PROAIR HFA) 108 (90 BASE) MCG/ACT inhaler Inhale 2 puffs into the lungs 4 (four) times daily as needed for wheezing or shortness of breath. 3 Inhaler 3  . amLODipine (NORVASC) 10 MG tablet Take 1 tablet by mouth daily.    Marland Kitchen. atorvastatin (LIPITOR) 10 MG tablet Take 10 mg by mouth daily.    . benzonatate (TESSALON) 200 MG capsule Take 1 capsule (200 mg total) by mouth 3 (three) times daily as needed for cough. 30 capsule 1  . budesonide-formoterol (SYMBICORT) 160-4.5 MCG/ACT inhaler Inhale 2 puffs into the lungs 2 (two) times daily. 1 Inhaler 6  . carboxymethylcellulose (REFRESH PLUS) 0.5 % SOLN 1 drop 2 (two) times daily.    Marland Kitchen. doxycycline (VIBRA-TABS) 100 MG tablet 2 today then one daily 8 tablet 0  . fluticasone furoate-vilanterol (BREO ELLIPTA) 100-25 MCG/INH AEPB Inhale 1 puff into the lungs daily. 1 each 5  . metoprolol (LOPRESSOR) 50 MG tablet Take 1 tablet by mouth 2 (two) times daily.     No current facility-administered medications on file prior to visit.

## 2016-03-06 NOTE — Telephone Encounter (Signed)
Spoke with [name]. She is aware of CY's recommendation. Rx has been sent in. Nothing further was needed.

## 2016-03-17 ENCOUNTER — Telehealth: Payer: Self-pay | Admitting: Internal Medicine

## 2016-03-17 MED ORDER — PREDNISONE 10 MG PO TABS: ORAL_TABLET | ORAL | 0 refills | Status: DC

## 2016-03-17 NOTE — Telephone Encounter (Signed)
Spoke with pt's daughter, [name]. She is aware of CY's recommendation. Rx has been sent in. Nothing further was needed.

## 2016-03-17 NOTE — Telephone Encounter (Signed)
Offer prednisone 10 mg, # 20, 4 X 2 DAYS, 3 X 2 DAYS, 2 X 2 DAYS, 1 X 2 DAYS  

## 2016-03-17 NOTE — Telephone Encounter (Signed)
CY  Please Advise-Sick Message  Kathleen MortimerWanda called on the behalf of this pt. She is the healthcare POA. She called in and states Kathleen Conrad has a rattling in her chest, she has a light cough. She denies fever, chest tightness, fever.  She is requesting prednisone to be called into the pharmacy.

## 2016-03-22 ENCOUNTER — Telehealth: Payer: Self-pay | Admitting: Internal Medicine

## 2016-03-22 NOTE — Telephone Encounter (Signed)
Spoke with caller-Wanda-she is aware that CY will give instructions on cough syrup Rx and pred taper in the morning as he is out of the office now.

## 2016-03-22 NOTE — Telephone Encounter (Signed)
Spoke with Kathleen Conrad's niece, who states Kathleen Conrad will finish her prednisone taper tomorrow that she was prescribed on 03-17-16. Kathleen Conrad states Kathleen Conrad has improved, however Kathleen Conrad is still having coughing spells. Kathleen Conrad is coughing up yellow mucus. Kathleen Conrad is concerned that Kathleen Conrad may need another round of prednisone and a cough syrup.  Kathleen Conrad denies any fever, chills or sweats.   CY please advise. Thanks.  Current Outpatient Prescriptions on File Prior to Visit  Medication Sig Dispense Refill  . albuterol (PROAIR HFA) 108 (90 BASE) MCG/ACT inhaler Inhale 2 puffs into the lungs 4 (four) times daily as needed for wheezing or shortness of breath. 3 Inhaler 3  . amLODipine (NORVASC) 10 MG tablet Take 1 tablet by mouth daily.    Marland Kitchen. atorvastatin (LIPITOR) 10 MG tablet Take 10 mg by mouth daily.    . benzonatate (TESSALON) 200 MG capsule Take 1 capsule (200 mg total) by mouth 3 (three) times daily as needed for cough. 30 capsule 1  . budesonide-formoterol (SYMBICORT) 160-4.5 MCG/ACT inhaler Inhale 2 puffs into the lungs 2 (two) times daily. 1 Inhaler 6  . carboxymethylcellulose (REFRESH PLUS) 0.5 % SOLN 1 drop 2 (two) times daily.    Marland Kitchen. doxycycline (VIBRA-TABS) 100 MG tablet Take 1 tablet (100 mg total) by mouth daily. 7 tablet 0  . fluticasone furoate-vilanterol (BREO ELLIPTA) 100-25 MCG/INH AEPB Inhale 1 puff into the lungs daily. 1 each 5  . metoprolol (LOPRESSOR) 50 MG tablet Take 1 tablet by mouth 2 (two) times daily.    . predniSONE (DELTASONE) 10 MG tablet 4 X 2 DAYS, 3 X 2 DAYS, 2 X 2 DAYS, 1 X 2 DAYS 20 tablet 0   No current facility-administered medications on file prior to visit.     Allergies  Allergen Reactions  . Aspirin   . Diclofenac Sodium   . Levofloxacin   . Lisinopril   . Metformin And Related Diarrhea  . Niacin   . Ofloxacin     REACTION: angioedema

## 2016-03-23 ENCOUNTER — Telehealth: Payer: Self-pay | Admitting: Internal Medicine

## 2016-03-23 MED ORDER — PREDNISONE 20 MG PO TABS: 20.0000 mg | ORAL_TABLET | Freq: Every day | ORAL | 0 refills | Status: DC

## 2016-03-23 MED ORDER — DOXYCYCLINE HYCLATE 100 MG PO TABS: ORAL_TABLET | ORAL | 0 refills | Status: DC

## 2016-03-23 MED ORDER — PREDNISONE 10 MG PO TABS: ORAL_TABLET | ORAL | 0 refills | Status: DC

## 2016-03-23 NOTE — Telephone Encounter (Signed)
Offer prednisone 20 mg, # 5, 1 daily, then stop                     Suggest Delsym cough syrup otc           Offer doxycycline 100 mg, # 8, 2 today then one daily

## 2016-03-23 NOTE — Telephone Encounter (Signed)
Spoke with pharmacist, who states pt is allergic to yellow dye and the 20mg  prednisone are yellow. I have sent in a new Rx for 10mg  tablets, as they are white. Nothing further needed.

## 2016-03-23 NOTE — Telephone Encounter (Signed)
Spoke with [name], and made her aware of CY's recommendations. Rx sent to preferred pharmacy. Nothing further needed.

## 2016-03-24 ENCOUNTER — Telehealth: Payer: Self-pay | Admitting: Internal Medicine

## 2016-03-24 NOTE — Telephone Encounter (Signed)
CY  Please Advise-  Kathleen MortimerWanda called and stated that this pt. Is not getting better even on the prednisone. She [facility] since her symptoms has been on going for 3 weeks.

## 2016-03-24 NOTE — Telephone Encounter (Signed)
Spoke with [name] and informed her of CY recc. An office visit was made with NP on Monday. Nothing further is needed at this time.

## 2016-03-24 NOTE — Telephone Encounter (Signed)
Ok to see me or an NP

## 2016-03-27 ENCOUNTER — Ambulatory Visit (INDEPENDENT_AMBULATORY_CARE_PROVIDER_SITE_OTHER)
Admission: RE | Admit: 2016-03-27 | Discharge: 2016-03-27 | Disposition: A | Discharge status: Home / Self Care | Payer: Medicare HMO | Source: Ambulatory Visit | Attending: Adult Health | Admitting: Adult Health

## 2016-03-27 ENCOUNTER — Ambulatory Visit (INDEPENDENT_AMBULATORY_CARE_PROVIDER_SITE_OTHER): Payer: Medicare HMO | Admitting: Adult Health

## 2016-03-27 ENCOUNTER — Encounter: Payer: Self-pay | Admitting: Adult Health

## 2016-03-27 VITALS — BP 104/60 | HR 67 | Temp 98.7°F | Ht 64.0 in | Wt 155.2 lb

## 2016-03-27 DIAGNOSIS — R059 Cough, unspecified: Secondary | ICD-10-CM

## 2016-03-27 DIAGNOSIS — J45909 Unspecified asthma, uncomplicated: Secondary | ICD-10-CM

## 2016-03-27 DIAGNOSIS — R05 Cough: Secondary | ICD-10-CM

## 2016-03-27 MED ORDER — METHYLPREDNISOLONE ACETATE 80 MG/ML IJ SUSP
80.0000 mg | Freq: Once | INTRAMUSCULAR | Status: AC
  Administered 2016-03-27: 80 mg via INTRAMUSCULAR

## 2016-03-27 MED ORDER — AZITHROMYCIN 250 MG PO TABS: ORAL_TABLET | ORAL | 0 refills | Status: DC

## 2016-03-27 MED ORDER — LEVALBUTEROL HCL 0.63 MG/3ML IN NEBU
0.6300 mg | INHALATION_SOLUTION | Freq: Once | RESPIRATORY_TRACT | Status: AC
  Administered 2016-03-27: 0.63 mg via RESPIRATORY_TRACT

## 2016-03-27 MED ORDER — PREDNISONE 10 MG PO TABS: ORAL_TABLET | ORAL | 0 refills | Status: DC

## 2016-03-27 NOTE — Addendum Note (Signed)
Addended by: Abigail MiyamotoPHELPS, DENISE D on: 03/27/2016 12:23 PM   Modules accepted: Orders

## 2016-03-27 NOTE — Progress Notes (Signed)
 @Patient  ID: Kathleen Conrad, female    DOB: [birth year], 83 y.o.   MRN: [id number]  Chief Complaint  Patient presents with  . Acute Visit    Asthma     Referring provider: Paulino DoorWeiser, Mark, MD  HPI: 83 year old female never smoker followed for asthma, allergic rhinitis compensated by GERD  03/27/2016 Acute OV  Patient presented for an acute office visit Patient complains of a 2 week of cough, congestion , wheezing ,  She was called in prednisone and doxycycline w/ one day left.  She has felt some better but has lingering cough, yellow mucus and wheezing . She still feels weak.   Appetite is getting some better on prednisone . No n/v.d.  No fever, or body aches. No weight loss.  Remains on Symbicort .  CXR today shows BB atx.   Allergies  Allergen Reactions  . Aspirin   . Diclofenac Sodium   . Levofloxacin   . Lisinopril   . Metformin And Related Diarrhea  . Niacin   . Ofloxacin     REACTION: angioedema    Immunization History  Administered Date(s) Administered  . Influenza Split 10/29/2010, 01/22/2015  . Influenza Whole 11/28/2011, 11/27/2012  . Influenza,inj,Quad PF,36+ Mos 12/28/2015  . Pneumococcal Conjugate-13 12/29/2015    Past Medical History:  Diagnosis Date  . Allergic rhinitis   . Asthma   . GERD (gastroesophageal reflux disease)   . PNA (pneumonia)     Tobacco History: History  Smoking Status  . Never Smoker  Smokeless Tobacco  . Never Used   Counseling given: Not Answered   Outpatient Encounter Prescriptions as of 03/27/2016  Medication Sig  . albuterol (PROAIR HFA) 108 (90 BASE) MCG/ACT inhaler Inhale 2 puffs into the lungs 4 (four) times daily as needed for wheezing or shortness of breath.  Marland Kitchen. amLODipine (NORVASC) 10 MG tablet Take 1 tablet by mouth daily.  Marland Kitchen. atorvastatin (LIPITOR) 10 MG tablet Take 10 mg by mouth daily.  . benzonatate (TESSALON) 200 MG capsule Take 1 capsule (200 mg total) by mouth 3 (three) times daily as needed for cough.    . budesonide-formoterol (SYMBICORT) 160-4.5 MCG/ACT inhaler Inhale 2 puffs into the lungs 2 (two) times daily.  Marland Kitchen. doxycycline (VIBRA-TABS) 100 MG tablet 2 tablets the first day followed by 1 tablet daily until finished  . metoprolol (LOPRESSOR) 50 MG tablet Take 1 tablet by mouth 2 (two) times daily.  . predniSONE (DELTASONE) 10 MG tablet 2 tablet daily X 5 days then stop  . carboxymethylcellulose (REFRESH PLUS) 0.5 % SOLN 1 drop 2 (two) times daily.  . fluticasone furoate-vilanterol (BREO ELLIPTA) 100-25 MCG/INH AEPB Inhale 1 puff into the lungs daily. (Patient not taking: Reported on 03/27/2016)  . [DISCONTINUED] doxycycline (VIBRA-TABS) 100 MG tablet Take 1 tablet (100 mg total) by mouth daily.   No facility-administered encounter medications on file as of 03/27/2016.      Review of Systems  Constitutional:   No  weight loss, night sweats,  Fevers, chills, +fatigue, or  lassitude.  HEENT:   No headaches,  Difficulty swallowing,  Tooth/dental problems, or  Sore throat,                No sneezing, itching, ear ache,  +nasal congestion, post nasal drip,   CV:  No chest pain,  Orthopnea, PND, swelling in lower extremities, anasarca, dizziness, palpitations, syncope.   GI  No heartburn, indigestion, abdominal pain, nausea, vomiting, diarrhea, change in bowel habits, loss of appetite, bloody stools.  Resp: .  No chest wall deformity  Skin: no rash or lesions.  GU: no dysuria, change in color of urine, no urgency or frequency.  No flank pain, no hematuria   MS:  No joint pain or swelling.  No decreased range of motion.  No back pain.    Physical Exam  BP 104/60   Pulse 67   Temp 98.7 F (37.1 C) (Oral)   Ht 5\' 4"  (1.626 m)   Wt 155 lb 3.2 oz (70.4 kg)   SpO2 94%   BMI 26.64 kg/m   GEN: A/Ox3; pleasant , NAD, elderly and frail    HEENT:  [place]/AT,  EACs-clear, TMs-wnl, NOSE-clear drainage  THROAT-clear, no lesions, no postnasal drip or exudate noted.   NECK:  Supple w/ fair  ROM; no JVD; normal carotid impulses w/o bruits; no thyromegaly or nodules palpated; no lymphadenopathy.    RESP  Clear  P & A; w/o, wheezes/ rales/ or rhonchi. no accessory muscle use, no dullness to percussion  CARD:  RRR, no m/r/g, no peripheral edema, pulses intact, no cyanosis or clubbing.  GI:   Soft & nt; nml bowel sounds; no organomegaly or masses detected.   Musco: Warm bil, no deformities or joint swelling noted.   Neuro: alert, no focal deficits noted.    Skin: Warm, no lesions or rashes  Psych:  No change in mood or affect. No depression or anxiety.  No memory loss.  Lab Results:  CBC No results found for: WBC, RBC, HGB, HCT, PLT, MCV, MCH, MCHC, RDW, LYMPHSABS, MONOABS, EOSABS, BASOSABS  BMET No results found for: NA, K, CL, CO2, GLUCOSE, BUN, CREATININE, CALCIUM, GFRNONAA, GFRAA  BNP No results found for: BNP  ProBNP No results found for: PROBNP  Imaging: Dg Chest 2 View  Result Date: 03/27/2016 CLINICAL DATA:  Cough, congestion and shortness of breath for 1 week, history diabetes mellitus, asthma, GERD EXAM: CHEST  2 VIEW COMPARISON:  03/06/2012 FINDINGS: Mild enlargement of cardiac silhouette. Atherosclerotic calcification aorta. Mediastinal contours and pulmonary vascularity normal. Mild bibasilar atelectasis. Lungs otherwise clear. No pleural effusion or pneumothorax. Multilevel endplate spur formation thoracic spine. IMPRESSION: Bibasilar atelectasis. Enlargement of cardiac silhouette. Aortic atherosclerosis. Electronically Signed   By: Ulyses Southward M.D.   On: 03/27/2016 10:59    CXR reviewed personally and BB atx noted, no definitie acute infiltrate   Assessment & Plan:   Asthma with bronchitis Slow to resolve flare  cxr w/ no acute process, BB atx  Xopenex neb x 1 in office .  Add Zithromax and pred taper  Depo Medrol x 1   Plan  Patient Instructions  Zpack take as directed.  Mucinex DM Twice daily  As needed  Cough/congestion  Prednisone taper  over next week.  Fluids and rest.  follow up Dr. Maple Hudson  4-6 weeks and As needed   Please contact office for sooner follow up if symptoms do not improve or worsen or seek emergency care         Rubye Oaks, NP 03/27/2016

## 2016-03-27 NOTE — Patient Instructions (Signed)
Zpack take as directed.  Mucinex DM Twice daily  As needed  Cough/congestion  Prednisone taper over next week.  Fluids and rest.  follow up Dr. Maple HudsonYoung  4-6 weeks and As needed   Please contact office for sooner follow up if symptoms do not improve or worsen or seek emergency care

## 2016-03-27 NOTE — Assessment & Plan Note (Signed)
Slow to resolve flare  cxr w/ no acute process, BB atx  Xopenex neb x 1 in office .  Add Zithromax and pred taper  Depo Medrol x 1   Plan  Patient Instructions  Zpack take as directed.  Mucinex DM Twice daily  As needed  Cough/congestion  Prednisone taper over next week.  Fluids and rest.  follow up Dr. Maple HudsonYoung  4-6 weeks and As needed   Please contact office for sooner follow up if symptoms do not improve or worsen or seek emergency care

## 2016-05-17 ENCOUNTER — Ambulatory Visit (INDEPENDENT_AMBULATORY_CARE_PROVIDER_SITE_OTHER): Payer: Medicare HMO | Admitting: Internal Medicine

## 2016-05-17 ENCOUNTER — Encounter: Payer: Self-pay | Admitting: Internal Medicine

## 2016-05-17 VITALS — BP 124/80 | HR 55 | Ht 64.0 in | Wt 163.9 lb

## 2016-05-17 DIAGNOSIS — J45909 Unspecified asthma, uncomplicated: Secondary | ICD-10-CM | POA: Diagnosis not present

## 2016-05-17 DIAGNOSIS — J4530 Mild persistent asthma, uncomplicated: Secondary | ICD-10-CM

## 2016-05-17 MED ORDER — GLYCOPYRROLATE 15.6 MCG IN CAPS: 1.0000 | ORAL_CAPSULE | Freq: Two times a day (BID) | RESPIRATORY_TRACT | 0 refills | Status: DC

## 2016-05-17 MED ORDER — PREDNISONE 10 MG PO TABS: ORAL_TABLET | ORAL | 0 refills | Status: DC

## 2016-05-17 MED ORDER — LEVALBUTEROL HCL 0.63 MG/3ML IN NEBU
0.6300 mg | INHALATION_SOLUTION | Freq: Once | RESPIRATORY_TRACT | Status: AC
  Administered 2016-05-17: 0.63 mg via RESPIRATORY_TRACT

## 2016-05-17 NOTE — Progress Notes (Signed)
HPI female never smoker followed for asthma/bronchitis, allergic rhinitis, complicated by GERD, glaucoma, DM 2  ---------------------------------------------------  03/02/2016-83 year old female never smoker followed for asthma/bronchitis, allergic rhinitis, complicated by GERD, glaucoma, DM 2 FOLLOWS FOR: Reports SOB, chest tightness, cough with production of green mucus, sore throat and wheezing. Denies fever. Symptoms started on Sunday. Acute visit-4 days increasing cough with progressive green sputum. Not sure about fever. Initial sore throat. No blood, pain or GI upset. Had both flu and Prevnar vaccines at drugstore November 1. Concerned about cost of Breo and we discussed how to check insurance formulary for preferred alternative.  05/17/2016-83 yo female never smoker followed for asthma/bronchitis, allergic rhinitis, complicated by GERD, glaucoma, DM 2 Follows For: asthma with bronchitis,on 04/03/2016 [facility]  pt reports she is doing better but still coughing and wheezing Hospital 04/03/2016 for acute UTI and bronchitis Then [facility] [place] LOV 03/27/2016-NP-acute exacerbation treated with Z-Pak, prednisone Now on doxycycline at with cortisone shot from PCP. Complains promethazine with codeine cough syrup is not stopping her cough which still produces some greenish sputum. Still wheezing. CXR 03/27/2016- IMPRESSION: Bibasilar atelectasis. Enlargement of cardiac silhouette. Aortic atherosclerosis.  ROS-see HPI Constitutional:   No-   weight loss, night sweats, +fevers, chills, fatigue, lassitude. HEENT:   No-  headaches, difficulty swallowing, tooth/dental problems, sore throat,       No-  sneezing, itching, ear ache,   nasal congestion, +post nasal drip,  CV:   chest pain, orthopnea, PND, swelling in lower extremities, anasarca,  dizziness, palpitations Resp: No- acute  shortness of breath with exertion or at rest.              + productive  cough,  No non-productive cough,  No- coughing up of blood.             +change in color of mucus.  + wheezing.   Skin: No-   rash or lesions. GI:  No-   heartburn, indigestion, abdominal pain, nausea, vomiting,  GU:  MS:  No-   joint pain or swelling.  . Neuro-     nothing unusual Psych:  No- change in mood or affect. No depression or anxiety.  No memory loss.  OBJ General- Alert, Oriented, Affect-appropriate, Distress-none acute Skin- rash-none, lesions- none, excoriation- none,  Lymphadenopathy- none Head- atraumatic            Eyes- Gross vision intact, PERRLA, conjunctivae clear secretions            Ears- Hearing, canals-normal            Nose- sniffing,+ turbinate edema, no-Septal dev, mucus, polyps, erosion, perforation             Throat- Mallampati II , mucosa normal , drainage-none, tonsils- atrophic. Mild hoarseness Neck- flexible , trachea midline, no stridor , thyroid nl, carotid no bruit Chest - symmetrical excursion , unlabored           Heart/CV- RRR , no murmur , no gallop  , no rub, nl s1 s2                           - JVD- none , edema- none, stasis changes- none, varices- none           Lung-  unlabored, + mild, cough-none , dullness-none, rub- none           Chest wall-  Abd- Br/ Gen/ Rectal- Not  done, not indicated Extrem- cyanosis- none, clubbing, none, atrophy- none, strength- nl Neuro- grossly intact to observation

## 2016-05-17 NOTE — Patient Instructions (Signed)
Finish the doxycycline  Sample [employer]Seebri Neohaler-    Inhale 1 cap twice daily till sample done  Neb xop 0.63  Script sent for prednisone taper

## 2016-05-19 NOTE — Assessment & Plan Note (Addendum)
Sequential exacerbations of asthmatic bronchitis consistent with partial failure to resolve, along with recurrent viral pattern infections. I think she's had enough antibiotic. Plan-Seebri inhaler- 1 cap twice daily for trial. Finish current doxycycline then stop antibiotics. Prednisone taper-hold if possible

## 2016-05-29 ENCOUNTER — Ambulatory Visit: Payer: Medicare HMO | Admitting: Internal Medicine

## 2016-05-29 ENCOUNTER — Telehealth: Payer: Self-pay | Admitting: Internal Medicine

## 2016-05-29 MED ORDER — GLYCOPYRROLATE 15.6 MCG IN CAPS: 1.0000 | ORAL_CAPSULE | Freq: Two times a day (BID) | RESPIRATORY_TRACT | 11 refills | Status: DC

## 2016-05-29 NOTE — Telephone Encounter (Signed)
Ok SeeBri Neohaler    # 60 caps    1 inhalation, twice daily    Refill x 12

## 2016-05-29 NOTE — Telephone Encounter (Signed)
CDY- you gave pt a sample on 05/17/16, but no rx given Is it okay to send rx to her pharm? They are calling b/c she brought them a coupon

## 2016-05-29 NOTE — Telephone Encounter (Signed)
rx was sent

## 2016-06-13 ENCOUNTER — Telehealth: Payer: Self-pay | Admitting: Internal Medicine

## 2016-06-13 MED ORDER — TRAMADOL HCL 50 MG PO TABS: ORAL_TABLET | ORAL | 0 refills | Status: DC

## 2016-06-13 MED ORDER — PREDNISONE 10 MG PO TABS: ORAL_TABLET | ORAL | 0 refills | Status: DC

## 2016-06-13 NOTE — Telephone Encounter (Signed)
CY  Please Advise- sick message  Pt's niece called on behalf of pt, states pt has been wheezing a lot, and coughing a lot more but non productive. She states pt did not express having any increase sob or fever. States pt has tried the benzonatate for the cough but it is not much relief. She wanted to know if we could call in something else for that will help as well as prednisone.  Allergies  Allergen Reactions  . Aspirin   . Diclofenac Sodium   . Levofloxacin   . Lisinopril   . Metformin And Related Diarrhea  . Niacin   . Ofloxacin     REACTION: angioedema

## 2016-06-13 NOTE — Telephone Encounter (Signed)
Called spoke with Kathleen Conrad and discussed CY's recommendations Kathleen Conrad voiced her understanding and was very grateful Rx's telephoned to [facility] in [place] - spoke with pharmacist Dale [place] is aware to contact the office if pt's symptoms do not improve or they worsen to please call the office Nothing further needed; will sign off

## 2016-06-13 NOTE — Telephone Encounter (Signed)
Offer prednisone 10 mg     # 10,    2 daily x 3 days, then one daily            Tramadol 50 mg, # 30, 1 or 2 every 8 hours if needed for cough

## 2016-07-25 ENCOUNTER — Telehealth: Payer: Self-pay

## 2016-07-25 NOTE — Telephone Encounter (Signed)
Started PA on covermymeds for PPL [employer]Seebri Neohaler for pt. Pending review.

## 2016-07-26 MED ORDER — GLYCOPYRROLATE-FORMOTEROL 9-4.8 MCG/ACT IN AERO: 2.0000 | INHALATION_SPRAY | Freq: Two times a day (BID) | RESPIRATORY_TRACT | 11 refills | Status: DC

## 2016-07-26 NOTE — Telephone Encounter (Signed)
Called [employer] to complete the PA for the CIGNASeebri neohaler.  They stated that the covered alternatives are:  spiriva respimat incruse spiriva handihaler  CY please advise if any of the alternatives would work effectively for the pt.  If not, then we can call back and finish the PA.  Thanks  Allergies  Allergen Reactions  . Aspirin   . Diclofenac Sodium   . Levofloxacin   . Lisinopril   . Metformin And Related Diarrhea  . Niacin   . Ofloxacin     REACTION: angioedema

## 2016-07-26 NOTE — Telephone Encounter (Signed)
Spoke with [employer] again. They stated that Anoro and Bevespi are both covered without the need for a PA. Stiolto would still need a PA.  Dr. Maple HudsonYoung, which medication would you like for the patient to try? Anoro or Bevespi? Thanks!

## 2016-07-26 NOTE — Telephone Encounter (Signed)
Medication has been called in.  

## 2016-07-26 NOTE — Telephone Encounter (Signed)
To be covered by her insurance, please change her SeeBri to [facility]   # 1, inhale 2 puffs,m twice daily, refill x 12

## 2016-07-26 NOTE — Telephone Encounter (Signed)
Left a message for patient to call back so that she would be aware of the medication change. Once patient is aware and is ok with it, a rx for the Bevespi will be sent in.

## 2016-07-26 NOTE — Telephone Encounter (Signed)
Listed alternatives are not equivalent to [place]. I needed to step up therapy for better control. Please ask insurance if they would cover Anoro, Bevespi or [employer]iolto

## 2016-07-26 NOTE — Telephone Encounter (Signed)
Viviann SpareSteven with [facility] calling about PA for Illa LevelSeebri Neohaler, CB is [phone], ref# [id number]. Needs clarification on a few things, need diagnosis and reason why covered alternatives wouldn't be effective.

## 2016-07-26 NOTE — Telephone Encounter (Signed)
Pt returning call a/b med change,I lete her know of med being changed to [facility] and she said that was fine and that we just need to call it in.Caren GriffinsStanley A Dalton

## 2016-08-17 ENCOUNTER — Telehealth: Payer: Self-pay | Admitting: Internal Medicine

## 2016-08-17 NOTE — Telephone Encounter (Signed)
Call[facility] is from [facility] and their office is closed at this time. Will hold for triage to try back tomorrow.

## 2016-08-18 NOTE — Telephone Encounter (Signed)
Suggest she change to Symbicort 160  Inhale 2 puffs then rinse mouth, twice daily Please let us know if she keeps having trouble.

## 2016-08-18 NOTE — Telephone Encounter (Signed)
Called and spoke with Leeann [facility]aitlyn Hoover, [facility] at [facility]---  She stated that they have seen the pt several times for the wheezing and asthma flare---she stated that the pt has been using the seebri and has had worse issues being on this medication.  Kaitlyn didn't  Know if CY wanted to see the pt earlier than next month or do you feel it is ok for Kaitlyn to change the inhaler to something that will help the pt more with the flares and wheezing.  CY please advise. Thanks Pt was last seen by CY in march and will be coming in next month.  Allergies  Allergen Reactions  . Aspirin   . Diclofenac Sodium   . Levofloxacin   . Lisinopril   . Metformin And Related Diarrhea  . Niacin   . Ofloxacin     REACTION: angioedema

## 2016-08-18 NOTE — Telephone Encounter (Signed)
[facility] and she is aware of CY recs.  She will call and speak with the pt and let them know.  Med list has been updated.

## 2016-09-18 ENCOUNTER — Ambulatory Visit (INDEPENDENT_AMBULATORY_CARE_PROVIDER_SITE_OTHER): Payer: Medicare HMO | Admitting: Internal Medicine

## 2016-09-18 ENCOUNTER — Encounter: Payer: Self-pay | Admitting: Internal Medicine

## 2016-09-18 DIAGNOSIS — K219 Gastro-esophageal reflux disease without esophagitis: Secondary | ICD-10-CM | POA: Diagnosis not present

## 2016-09-18 DIAGNOSIS — J4541 Moderate persistent asthma with (acute) exacerbation: Secondary | ICD-10-CM | POA: Diagnosis not present

## 2016-09-18 MED ORDER — FLUTICASONE-UMECLIDIN-VILANT 100-62.5-25 MCG/INH IN AEPB: 1.0000 | INHALATION_SPRAY | Freq: Every day | RESPIRATORY_TRACT | 0 refills | Status: DC

## 2016-09-18 MED ORDER — PROMETHAZINE-CODEINE 6.25-10 MG/5ML PO SYRP: 5.0000 mL | ORAL_SOLUTION | Freq: Four times a day (QID) | ORAL | 0 refills | Status: DC | PRN

## 2016-09-18 NOTE — Assessment & Plan Note (Signed)
Chronic asthmatic bronchitis with exacerbation. Queasy from antibiotics but that should resolve. Plan-we discussed reasons for wanting to minimize systemic steroids. Can try adding back an inhaled steroid by replacing both S/P with Trelegy as discussed. Plan-samples Trelegy, codeine cough syrup for limited use at night as discussed.

## 2016-09-18 NOTE — Patient Instructions (Addendum)
Sample x 2 Trelegy Ellipta inhaler     Inhale 1 puff then rinse mouth, once daily    Try this instead of Bevespi.                    When you run out of the Trelegy samples, go back to Bevespi 2 puffs, twice daily, for comparison.  Script for prometh codeine cough syrup printed- use this sparingly, mainly if needed at bedtime.

## 2016-09-18 NOTE — Assessment & Plan Note (Signed)
Reminder to follow reflux precautions. Cough may aggravate reflux which may aggravate cough.

## 2016-09-18 NOTE — Progress Notes (Signed)
HPI female never smoker followed for asthma/bronchitis, allergic rhinitis, complicated by GERD, glaucoma, DM 2  ---------------------------------------------------  05/17/2016-83 yo female never smoker followed for asthma/bronchitis, allergic rhinitis, complicated by GERD, glaucoma, DM 2 Follows For: asthma with bronchitis,on 04/03/2016 was in the ED at [facility]  pt reports she is doing better but still coughing and wheezing Hospital 04/03/2016 for acute UTI and bronchitis Then hospitalized in [facility] for bronchitis LOV 03/27/2016-NP-acute exacerbation treated with Z-Pak, prednisone Now on doxycycline at with cortisone shot from PCP. Complains promethazine with codeine cough syrup is not stopping her cough which still produces some greenish sputum. Still wheezing. CXR 03/27/2016- IMPRESSION: Bibasilar atelectasis. Enlargement of cardiac silhouette. Aortic atherosclerosis.  09/18/16- 83 yo female never smoker followed for asthma/bronchitis, allergic rhinitis, complicated by GERD, glaucoma, DM 2 FOLLOWS FOR:Pt was sent at PCP and given shot and abx to help with chest congestion and wheezing; she is no better today.  Albuterol rescue, Symbicort 160 and Bevespi both listed, using only both raspy. Uses nebulizer once daily. PCP gave antibiotic twice daily for 7 days, finished 1 week ago. This left her a little queasy without nausea or diarrhea. Chronic baseline cough is a little worse than usual still. Scant yellow sputum with no fever or blood.. Asks help with cough. Tessalon Perles not helpful. Robitussin-DM keeps her awake.  ROS-see HPI  + = pos Constitutional:   No-   weight loss, night sweats, +fevers, chills, fatigue, lassitude. HEENT:   No-  headaches, difficulty swallowing, tooth/dental problems, sore throat,       No-  sneezing, itching, ear ache,   nasal congestion, +post nasal drip,  CV:   chest pain, orthopnea, PND, swelling in lower extremities, anasarca,  dizziness,  palpitations Resp: No- acute  shortness of breath with exertion or at rest.              + productive cough,  No non-productive cough,  No- coughing up of blood.             +change in color of mucus.  + wheezing.   Skin: No-   rash or lesions. GI:  No-   heartburn, indigestion, abdominal pain, nausea, vomiting,  GU:  MS:  No-   joint pain or swelling.  . Neuro-     nothing unusual Psych:  No- change in mood or affect. No depression or anxiety.  No memory loss.  OBJ General- Alert, Oriented, Affect-appropriate, Distress-none acute Skin- rash-none, lesions- none, excoriation- none,  Lymphadenopathy- none Head- atraumatic            Eyes- Gross vision intact, PERRLA, conjunctivae clear secretions            Ears- Hearing, canals-normal            Nose- sniffing,+ turbinate edema, no-Septal dev, mucus, polyps, erosion, perforation             Throat- Mallampati II , mucosa normal , drainage-none, tonsils- atrophic. Mild hoarseness Neck- flexible , trachea midline, no stridor , thyroid nl, carotid no bruit Chest - symmetrical excursion , unlabored           Heart/CV- RRR , no murmur , no gallop  , no rub, nl s1 s2                           - JVD- none , edema- none, stasis changes- none, varices- none  Lung-  unlabored, wheeze + bilateral upper chest, cough-none , dullness-none, rub- none           Chest wall-  Abd- Br/ Gen/ Rectal- Not done, not indicated Extrem- cyanosis- none, clubbing, none, atrophy- none, strength- nl Neuro- grossly intact to observation

## 2016-09-19 ENCOUNTER — Telehealth: Payer: Self-pay | Admitting: Internal Medicine

## 2016-09-19 NOTE — Telephone Encounter (Signed)
[name] returned call. She states [facility] never received the rx for pt's cough syrup. Pt last seen on 7.23.18, the cough syrup was printed during the visit. Informed [name] of this and to look through the pt's papers from yesterday and to call back if there are any issues. Pt verbalized understanding and denied any further questions or concerns at this time.

## 2016-09-19 NOTE — Telephone Encounter (Signed)
ATC but call went straight to VM. Left message for niece to call back.

## 2017-01-29 ENCOUNTER — Ambulatory Visit: Payer: Medicare HMO | Admitting: Internal Medicine

## 2017-05-03 ENCOUNTER — Ambulatory Visit: Payer: Medicare HMO | Admitting: Internal Medicine

## 2017-07-20 ENCOUNTER — Ambulatory Visit: Payer: Medicare HMO | Admitting: Internal Medicine

## 2017-10-01 ENCOUNTER — Ambulatory Visit: Payer: Medicare HMO | Admitting: Internal Medicine

## 2017-10-01 ENCOUNTER — Encounter: Payer: Self-pay | Admitting: Internal Medicine

## 2017-10-01 VITALS — BP 124/76 | HR 63 | Ht 64.0 in | Wt 178.6 lb

## 2017-10-01 DIAGNOSIS — I2699 Other pulmonary embolism without acute cor pulmonale: Secondary | ICD-10-CM | POA: Diagnosis not present

## 2017-10-01 DIAGNOSIS — J302 Other seasonal allergic rhinitis: Secondary | ICD-10-CM | POA: Diagnosis not present

## 2017-10-01 DIAGNOSIS — J4530 Mild persistent asthma, uncomplicated: Secondary | ICD-10-CM | POA: Diagnosis not present

## 2017-10-01 DIAGNOSIS — J3089 Other allergic rhinitis: Secondary | ICD-10-CM | POA: Diagnosis not present

## 2017-10-01 NOTE — Assessment & Plan Note (Signed)
We discussed antihistamines and management of watery rhinorrhea.  If she avoids overdrying, I do not expect antihistamines to interfere with her Xarelto to cause nosebleeds.

## 2017-10-01 NOTE — Patient Instructions (Signed)
Ok to continue Symbicort 2 puffs, then rinse mouth, twice daily  Ok to use the ProAir rescue inhaler   2 puffs every 6 hours, If Needed  Please call if we can help

## 2017-10-01 NOTE — Progress Notes (Signed)
HPI female never smoker followed for asthma/bronchitis, allergic rhinitis, PE/ Xarelto complicated by GERD, glaucoma, DM 2, Gr2 diastolic dysfunction  --------------------------------------------------- 09/18/16- 84 yo female never smoker followed for asthma/bronchitis, allergic rhinitis, complicated by GERD, glaucoma, DM 2 FOLLOWS FOR:Pt was sent at PCP and given shot and abx to help with chest congestion and wheezing; she is no better today.  Albuterol rescue, Symbicort 160 and Bevespi both listed, using only both raspy. Uses nebulizer once daily. PCP gave antibiotic twice daily for 7 days, finished 1 week ago. This left her a little queasy without nausea or diarrhea. Chronic baseline cough is a little worse than usual still. Scant yellow sputum with no fever or blood.. Asks help with cough. Tessalon Perles not helpful. Robitussin-DM keeps her awake.  10/01/2017- 84 yo female never smoker followed for asthma/bronchitis, allergic rhinitis, PE/ Xarelto complicated by GERD, glaucoma, DM 2 Review of Care Everywhere indicates ED visit 09/14/2017 with pleuritic chest pain and dyspnea.  CT chest confirmed PE and right upper lobe.  Started on Xarelto for unprovoked PE.  Echocardiogram showed stable stage II diastolic dysfunction.  Also had bilateral lower lobe pneumonia described at an office visit on 07/30/2017 -----Asthmatic bronchitis: Pt is coughing at times it takes her breath away. Pt had episode of PE in Right lung.  Symbicort 160, pro-air HFA She and her daughter indicate she is back to baseline breathing status now after difficult spring and early summer.  No bleeding on Xarelto.  Occasional Claritin for postnasal drip-discussed.  Uses Symbicort and rarely needs rescue inhaler.  Does use her nebulizer machine each morning. CT [facility]-  1. Nonocclusive segmental and subsegmental pulmonary emboli noted in the right upper lobe. No evidence of right-sided heart strain. 2. Irregular linear  opacities in the right middle lobe, lingula, and bilateral lower lobes. Trace bilateral pleural fluid. Scattered areas of faint groundglass attenuation and mild septal thickening including some areas of mild mosaic attenuation. Extent of involvement has decreased since CT of March 30 and is suggestive of resolving infection and edema. More irregular linear opacities are favored to reflect atelectasis and/or scarring. 3. Mediastinal lymph nodes as well as hilar lymph nodes remain mildly enlarged but have decreased in prominence since March 30 CT, suggestive of resolving reactive lymphadenopathy.  ROS-see HPI  + = positive Constitutional:   No-   weight loss, night sweats, +fevers, chills, fatigue, lassitude. HEENT:   No-  headaches, difficulty swallowing, tooth/dental problems, sore throat,       No-  sneezing, itching, ear ache,   nasal congestion, +post nasal drip,  CV:   chest pain, orthopnea, PND, swelling in lower extremities, anasarca,  dizziness, palpitations Resp: No- acute  shortness of breath with exertion or at rest.               productive cough,  No non-productive cough,  No- coughing up of blood.             change in color of mucus.  + wheezing.   Skin: No-   rash or lesions. GI:  No-   heartburn, indigestion, abdominal pain, nausea, vomiting,  GU:  MS:  No-   joint pain or swelling.  . Neuro-     nothing unusual Psych:  No- change in mood or affect. No depression or anxiety.  No memory loss.  OBJ General- Alert, Oriented, Affect-appropriate, Distress-none acute Skin- rash-none, lesions- none, excoriation- none,  Lymphadenopathy- none Head- atraumatic  Eyes- Gross vision intact, PERRLA, conjunctivae clear secretions            Ears- Hearing, canals-normal            Nose- sniffing,+ turbinate edema, no-Septal dev, mucus, polyps, erosion, perforation             Throat- Mallampati II , mucosa normal , drainage-none, tonsils- atrophic. Mild hoarseness Neck- flexible  , trachea midline, no stridor , thyroid nl, carotid no bruit Chest - symmetrical excursion , unlabored           Heart/CV- RRR , no murmur , no gallop  , no rub, nl s1 s2                           - JVD- none , edema- none, stasis changes- none, varices- none           Lung-  unlabored, wheeze-none, cough-none , dullness-none, rub- none, + few basilar crackles           Chest wall-  Abd- Br/ Gen/ Rectal- Not done, not indicated Extrem- cyanosis- none, clubbing, none, atrophy- none, strength- nl Neuro- grossly intact to observation

## 2017-10-01 NOTE — Assessment & Plan Note (Signed)
Alto is appropriate.  My understanding is they will be referred by PCP to hematology for risk evaluation.  I discussed risks of bleeding versus clot, avoidance of venous stasis.

## 2017-10-01 NOTE — Assessment & Plan Note (Signed)
Now at baseline with good control using Symbicort, 1 daily nebulizer treatment, and not needing her rescue inhaler. Plan-continue present treatment.

## 2017-12-14 ENCOUNTER — Telehealth: Payer: Self-pay | Admitting: Internal Medicine

## 2017-12-14 MED ORDER — PREDNISONE 10 MG PO TABS: ORAL_TABLET | ORAL | 0 refills | Status: AC

## 2017-12-14 NOTE — Telephone Encounter (Signed)
Ok prednisone 10 mg, # 20, 4 X 2 DAYS, 3 X 2 DAYS, 2 X 2 DAYS, 1 X 2 DAYS  

## 2017-12-14 NOTE — Telephone Encounter (Signed)
Spoke with Kathleen Conrad and notified of recs per CDY  She verbalized understanding  Rx was sent  Pt to call for ov if not improving

## 2017-12-14 NOTE — Telephone Encounter (Signed)
Spoke with the pt's niece  She reports that pt has had increased cough and wheezing x 2 days  She is not coughing up any sputum  Denies increased SOB, fever, CP  She is requesting pred taper before symptoms get worse  She declined appt  Please advise thanks  Allergies  Allergen Reactions  . Ezetimibe Anaphylaxis  . Sulfamethoxazole-Trimethoprim Other (See Comments)  . Acetaminophen   . Aspirin   . Diclofenac   . Diclofenac Sodium   . Levofloxacin   . Lisinopril   . Metformin And Related Diarrhea  . Niacin   . Ofloxacin     REACTION: angioedema   Current Outpatient Medications on File Prior to Visit  Medication Sig Dispense Refill  . albuterol (PROAIR HFA) 108 (90 BASE) MCG/ACT inhaler Inhale 2 puffs into the lungs 4 (four) times daily as needed for wheezing or shortness of breath. 3 Inhaler 3  . amLODipine (NORVASC) 10 MG tablet Take 1 tablet by mouth daily.    Marland Kitchen atorvastatin (LIPITOR) 20 MG tablet Take 20 mg by mouth daily.  3  . carboxymethylcellulose (REFRESH PLUS) 0.5 % SOLN 1 drop 2 (two) times daily.    . Cholecalciferol (VITAMIN D-3) 1000 units CAPS Take 2,000 Units by mouth daily.    Marland Kitchen glipiZIDE (GLUCOTROL XL) 10 MG 24 hr tablet Take 1 tablet by mouth daily.    Marland Kitchen levothyroxine (SYNTHROID, LEVOTHROID) 75 MCG tablet Take 75 mcg by mouth daily.  3  . metoprolol tartrate (LOPRESSOR) 25 MG tablet Take 25 mg by mouth 2 (two) times daily.  3  . pioglitazone (ACTOS) 15 MG tablet Take 1 tablet by mouth daily.    . rivaroxaban (XARELTO) 20 MG TABS tablet Take 1 tablet by mouth daily.    . SYMBICORT 160-4.5 MCG/ACT inhaler Inhale 2 puffs into the lungs 2 (two) times daily.  5   No current facility-administered medications on file prior to visit.

## 2017-12-30 IMAGING — DX DG CHEST 2V
2 series · 2 of 2 positions shown · non-contrast
Comparison: 03/06/2012

CLINICAL DATA: Cough, congestion and shortness of breath for 1
week, history diabetes mellitus, asthma, GERD

EXAM:
CHEST  2 VIEW

[chest pa]
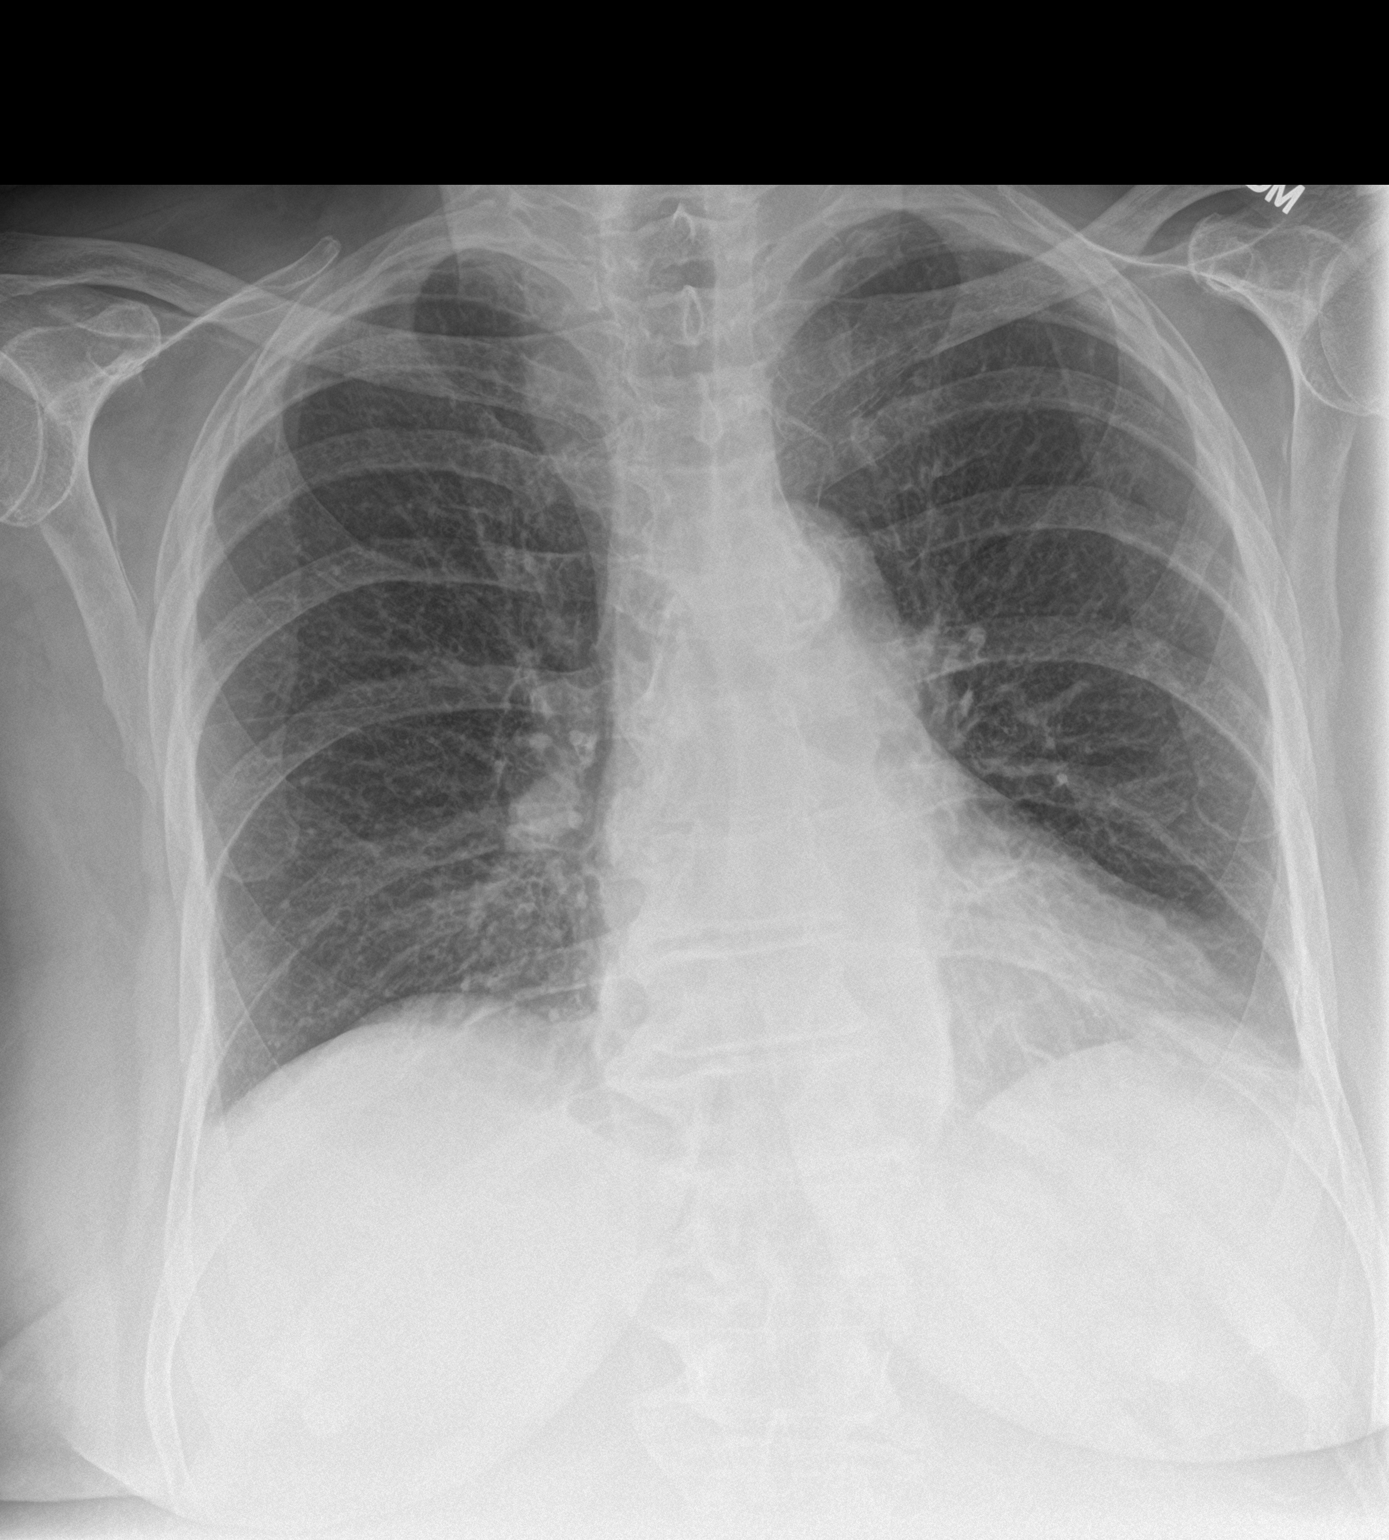

[chest lat]
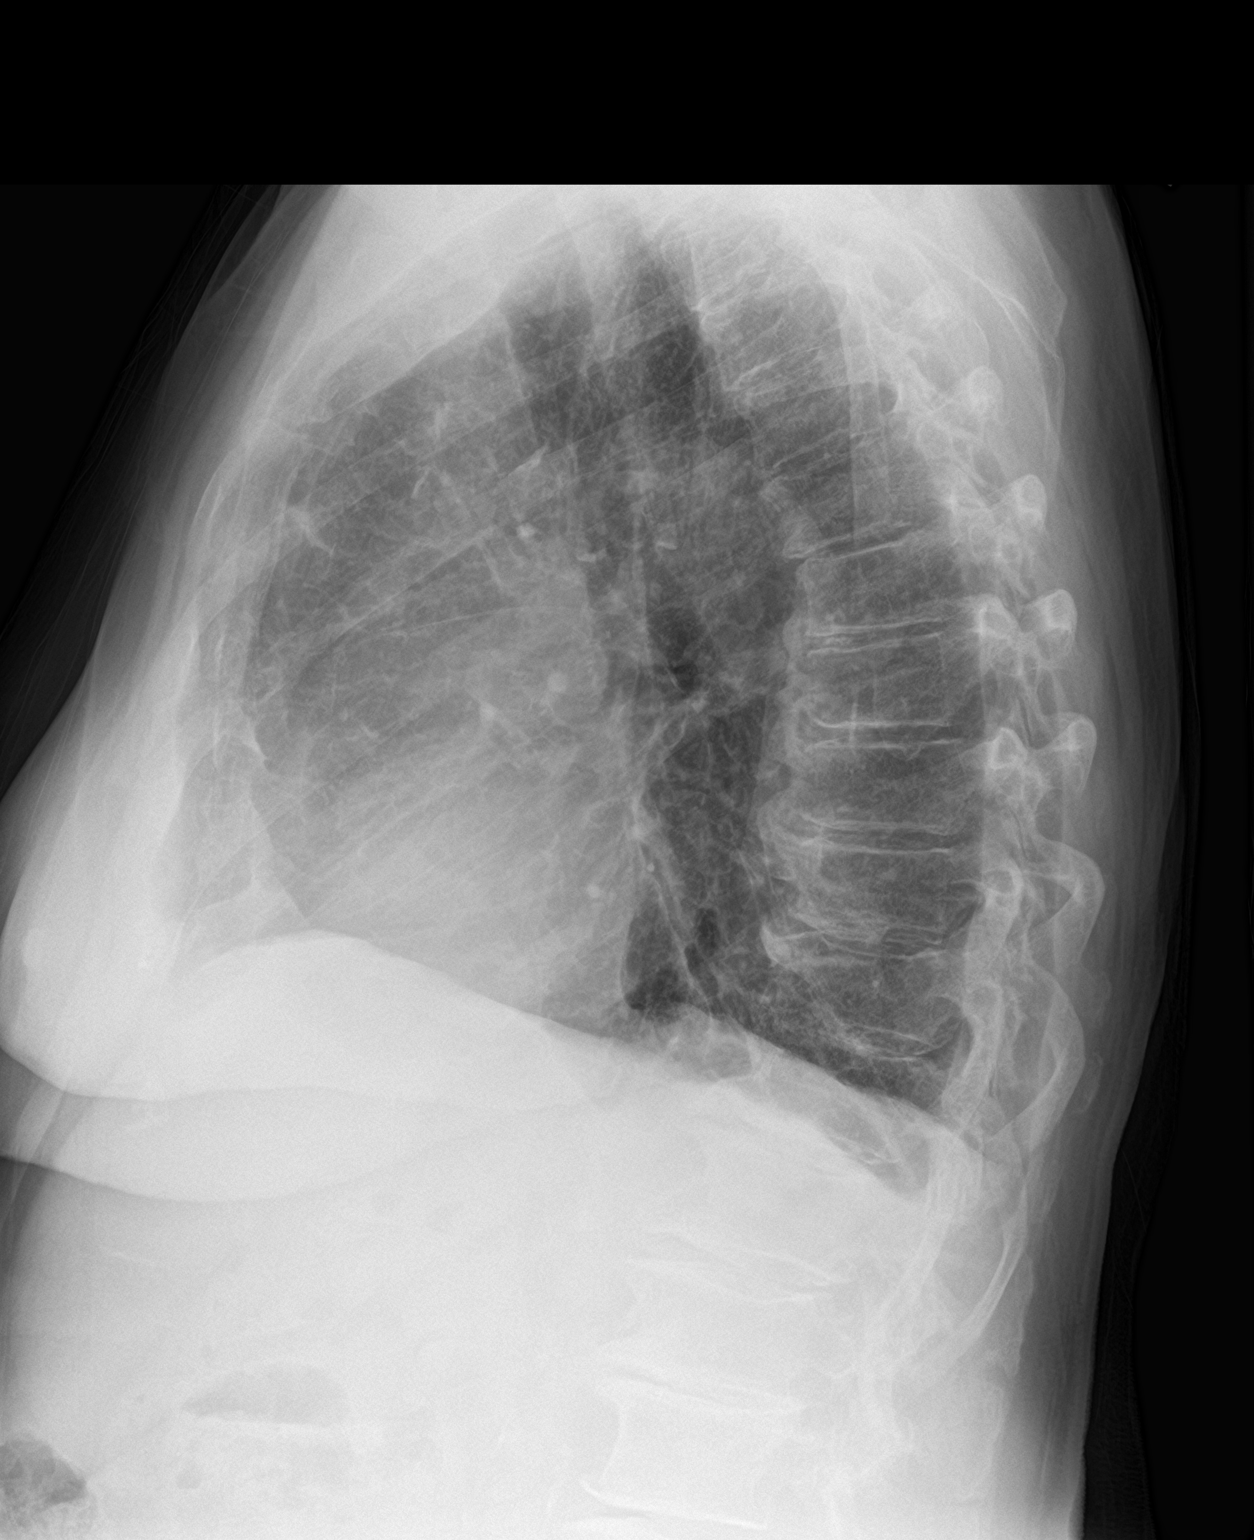

[2 of 2 positions shown; findings below may reference images not displayed]

FINDINGS: Mild enlargement of cardiac silhouette.

Atherosclerotic calcification aorta.

Mediastinal contours and pulmonary vascularity normal.

Mild bibasilar atelectasis.

Lungs otherwise clear.

No pleural effusion or pneumothorax.

Multilevel endplate spur formation thoracic spine.
IMPRESSION: Bibasilar atelectasis.

Enlargement of cardiac silhouette.

Aortic atherosclerosis.

## 2018-04-03 ENCOUNTER — Ambulatory Visit: Payer: Medicare HMO | Admitting: Internal Medicine

## 2018-06-19 ENCOUNTER — Ambulatory Visit: Payer: Medicare HMO | Admitting: Internal Medicine

## 2018-06-28 DEATH — deceased
